# Patient Record
Sex: Female | Born: 1950 | Race: Black or African American | Hispanic: No | Smoking: Never smoker
Health system: Southern US, Community
[De-identification: ages and names within clinical notes are randomized; demographics above are authoritative.]

## PROBLEM LIST (undated history)

## (undated) DIAGNOSIS — I82409 Acute embolism and thrombosis of unspecified deep veins of unspecified lower extremity: Secondary | ICD-10-CM

## (undated) DIAGNOSIS — I10 Essential (primary) hypertension: Secondary | ICD-10-CM

## (undated) HISTORY — PX: KNEE SURGERY: SHX244

## (undated) HISTORY — PX: JOINT REPLACEMENT: SHX530

## (undated) HISTORY — DX: Acute embolism and thrombosis of unspecified deep veins of unspecified lower extremity: I82.409

## (undated) HISTORY — PX: ABDOMINAL HYSTERECTOMY: SHX81

## (undated) HISTORY — DX: Essential (primary) hypertension: I10

## (undated) HISTORY — PX: KNEE ARTHROSCOPY: SUR90

---

## 2001-04-11 ENCOUNTER — Emergency Department (HOSPITAL_COMMUNITY): Admission: EM | Admit: 2001-04-11 | Discharge: 2001-04-11 | Payer: Self-pay | Admitting: Emergency Medicine

## 2001-04-11 ENCOUNTER — Encounter: Payer: Self-pay | Admitting: Emergency Medicine

## 2001-11-09 ENCOUNTER — Emergency Department (HOSPITAL_COMMUNITY): Admission: EM | Admit: 2001-11-09 | Discharge: 2001-11-09 | Payer: Self-pay | Admitting: Emergency Medicine

## 2001-11-09 ENCOUNTER — Encounter: Payer: Self-pay | Admitting: Emergency Medicine

## 2004-05-09 ENCOUNTER — Ambulatory Visit: Payer: Self-pay | Admitting: *Deleted

## 2004-05-10 ENCOUNTER — Ambulatory Visit: Payer: Self-pay | Admitting: *Deleted

## 2004-05-10 ENCOUNTER — Ambulatory Visit (HOSPITAL_COMMUNITY): Admission: RE | Admit: 2004-05-10 | Discharge: 2004-05-10 | Payer: Self-pay | Admitting: *Deleted

## 2004-05-24 ENCOUNTER — Ambulatory Visit (HOSPITAL_COMMUNITY): Admission: RE | Admit: 2004-05-24 | Discharge: 2004-05-24 | Payer: Self-pay | Admitting: *Deleted

## 2004-05-24 ENCOUNTER — Ambulatory Visit: Payer: Self-pay | Admitting: Cardiology

## 2004-06-02 ENCOUNTER — Ambulatory Visit: Payer: Self-pay | Admitting: *Deleted

## 2005-03-16 ENCOUNTER — Encounter (HOSPITAL_COMMUNITY): Admission: RE | Admit: 2005-03-16 | Discharge: 2005-04-07 | Payer: Self-pay | Admitting: Neurosurgery

## 2005-03-17 ENCOUNTER — Ambulatory Visit (HOSPITAL_COMMUNITY): Admission: RE | Admit: 2005-03-17 | Discharge: 2005-03-17 | Payer: Self-pay | Admitting: Family Medicine

## 2005-04-07 ENCOUNTER — Ambulatory Visit (HOSPITAL_COMMUNITY): Admission: RE | Admit: 2005-04-07 | Discharge: 2005-04-07 | Payer: Self-pay | Admitting: Internal Medicine

## 2005-04-07 ENCOUNTER — Ambulatory Visit: Payer: Self-pay | Admitting: Internal Medicine

## 2008-06-12 ENCOUNTER — Ambulatory Visit: Payer: Self-pay | Admitting: Cardiology

## 2008-06-17 ENCOUNTER — Ambulatory Visit: Payer: Self-pay | Admitting: Internal Medicine

## 2008-06-17 ENCOUNTER — Ambulatory Visit (HOSPITAL_COMMUNITY): Admission: RE | Admit: 2008-06-17 | Discharge: 2008-06-17 | Payer: Self-pay | Admitting: Cardiology

## 2008-06-17 ENCOUNTER — Encounter: Payer: Self-pay | Admitting: Cardiology

## 2008-10-15 ENCOUNTER — Telehealth: Payer: Self-pay | Admitting: Cardiology

## 2008-11-16 ENCOUNTER — Ambulatory Visit (HOSPITAL_COMMUNITY): Admission: RE | Admit: 2008-11-16 | Discharge: 2008-11-16 | Payer: Self-pay | Admitting: Family Medicine

## 2010-09-02 NOTE — Procedures (Signed)
NAME:  Meredith Warner, Meredith Warner NO.:  [id number]   MEDICAL RECORD NO.:  [id number]          PATIENT TYPE:  OUT   LOCATION:  RAD                           FACILITY:  [facility]   PHYSICIAN:  Vida Roller, M.D.   DATE OF BIRTH:  [birth year]   DATE OF PROCEDURE:  05/10/2004  DATE OF DISCHARGE:                                  ECHOCARDIOGRAM   PRIMARY CARE PHYSICIAN:  Patrica Duel, M.D.   TAPE NUMBER:  LV [id number].   TAPE COUNT:  [id number] through 7132.   This is a 60 year old woman with chest discomfort and a history of lower  extremity DVT.   The technical quality of the study is adequate.   M-MODE:  Aorta is 20 mm.  Left atrium 47 mm.  Septum is 11 mm.  Posterior  wall 11 mm.  Left jugular diastolic dimension is 40 mm.  Left ventricular  systolic dimension 26 mm.   2D AND DOPPLER IMAGING:  The left ventricle is normal size with normal  systolic function. Estimated ejection fraction is 55-60%. There are no  regional wall motion abnormalities. There is wall thickness which is top  limits of normal but no obvious left ventricular hypertrophy.   The right ventricle is top-normal size. There are some views, especially the  apical four-chamber view, which implies a slightly enlarged right ventricle;  however, the subcostal views do not verify this. There appears to be normal  right ventricular systolic function. The right ventricular systolic  pressure, as estimated by the tricuspid regurgitation jet velocity, is 20-25  mmHg, which is within normal limits.   The atria are both dilated, left greater than right.   The aortic valve is very mildly sclerotic with no evidence of stenosis or  regurgitation.   There is trivial mitral regurgitation with no mitral stenosis.   Tricuspid valve has mild regurgitation, which is in the central jet. There  is no evidence of annular dilatation.   Pulmonic valves is not well seen.   The ascending aorta is not well seen.   There is no  pericardial effusion.   The inferior vena cava appears to be normal size.      JH/MEDQ  D:  05/10/2004  T:  05/10/2004  Job:  [id number]   cc:   Patrica Duel, M.D.  [address], [address]  [place]  [place] [address]  Fax: [phone]

## 2010-09-02 NOTE — Op Note (Signed)
NAME:  [name], [name] NO.:  [id number]   MEDICAL RECORD NO.:  [id number]          PATIENT TYPE:  AMB   LOCATION:  DAY                           FACILITY:  [facility]   PHYSICIAN:  Lionel December, M.D.    DATE OF BIRTH:  06/23/1950   DATE OF PROCEDURE:  04/07/2005  DATE OF DISCHARGE:                                 OPERATIVE REPORT   PROCEDURE:  Colonoscopy.   INDICATIONS:  Meredith Warner is a 60 year old Afro-American female who is undergoing  screening colonoscopy. Family history is negative for colorectal carcinoma.  Procedure and risk were reviewed with the patient, and informed consent was  obtained.   MEDICINES FOR CONSCIOUS SEDATION:  Demerol 50 mg IV, Versed 8 mg IV.   FINDINGS:  Procedure performed in endoscopy suite. The patient's vital signs  and O2 saturation were monitored during the procedure and remained stable.  The patient was placed in left lateral position and rectal examination  performed. No abnormality noted on external or digital exam. Olympus  videoscope was placed in rectum and advanced under vision in sigmoid colon  where few tiny diverticula were noted. Preparation was felt to be excellent.  Scope was advanced into cecum which was identified by lipomatous ileocecal  valve and appendiceal orifice. Pictures taken for the record. As the scope  was withdrawn, colonic mucosa was carefully examined and was normal  throughout. Rectal mucosa similarly was normal. Scope was retroflexed to  examine anorectal junction, and small hemorrhoids were noted below the  dentate line. Endoscope was straightened and withdrawn. The patient  tolerated the procedure well.   FINAL DIAGNOSIS:  Few tiny diverticula at sigmoid colon and small external  hemorrhoids, otherwise normal colonoscopy.   RECOMMENDATIONS:  1.  She will resume her usual diet and medications.  2.  High-fiber diet.  3.  Yearly Hemoccults.  4.  She may consider next screening exam in 10 years from  now.      Lionel December, M.D.  Electronically Signed     NR/MEDQ  D:  04/07/2005  T:  04/08/2005  Job:  [id number]

## 2015-03-19 ENCOUNTER — Encounter (INDEPENDENT_AMBULATORY_CARE_PROVIDER_SITE_OTHER): Payer: Self-pay | Admitting: *Deleted

## 2017-11-21 DIAGNOSIS — Z6841 Body Mass Index (BMI) 40.0 and over, adult: Secondary | ICD-10-CM | POA: Diagnosis not present

## 2017-11-21 DIAGNOSIS — Z0000 Encounter for general adult medical examination without abnormal findings: Secondary | ICD-10-CM | POA: Diagnosis not present

## 2017-12-11 DIAGNOSIS — R928 Other abnormal and inconclusive findings on diagnostic imaging of breast: Secondary | ICD-10-CM | POA: Diagnosis not present

## 2017-12-11 DIAGNOSIS — Z1231 Encounter for screening mammogram for malignant neoplasm of breast: Secondary | ICD-10-CM | POA: Diagnosis not present

## 2017-12-24 DIAGNOSIS — E2839 Other primary ovarian failure: Secondary | ICD-10-CM | POA: Diagnosis not present

## 2017-12-24 DIAGNOSIS — M810 Age-related osteoporosis without current pathological fracture: Secondary | ICD-10-CM | POA: Diagnosis not present

## 2017-12-26 DIAGNOSIS — R590 Localized enlarged lymph nodes: Secondary | ICD-10-CM | POA: Diagnosis not present

## 2017-12-26 DIAGNOSIS — R928 Other abnormal and inconclusive findings on diagnostic imaging of breast: Secondary | ICD-10-CM | POA: Diagnosis not present

## 2018-02-18 DIAGNOSIS — E7849 Other hyperlipidemia: Secondary | ICD-10-CM | POA: Diagnosis not present

## 2018-02-18 DIAGNOSIS — Z6841 Body Mass Index (BMI) 40.0 and over, adult: Secondary | ICD-10-CM | POA: Diagnosis not present

## 2018-02-18 DIAGNOSIS — I10 Essential (primary) hypertension: Secondary | ICD-10-CM | POA: Diagnosis not present

## 2018-02-18 DIAGNOSIS — Z0000 Encounter for general adult medical examination without abnormal findings: Secondary | ICD-10-CM | POA: Diagnosis not present

## 2018-03-12 DIAGNOSIS — F4310 Post-traumatic stress disorder, unspecified: Secondary | ICD-10-CM | POA: Diagnosis not present

## 2018-03-12 DIAGNOSIS — F331 Major depressive disorder, recurrent, moderate: Secondary | ICD-10-CM | POA: Diagnosis not present

## 2018-05-21 DIAGNOSIS — Z6841 Body Mass Index (BMI) 40.0 and over, adult: Secondary | ICD-10-CM | POA: Diagnosis not present

## 2018-05-21 DIAGNOSIS — I10 Essential (primary) hypertension: Secondary | ICD-10-CM | POA: Diagnosis not present

## 2018-05-21 DIAGNOSIS — E7849 Other hyperlipidemia: Secondary | ICD-10-CM | POA: Diagnosis not present

## 2018-06-03 DIAGNOSIS — M79604 Pain in right leg: Secondary | ICD-10-CM | POA: Diagnosis not present

## 2018-06-03 DIAGNOSIS — M7989 Other specified soft tissue disorders: Secondary | ICD-10-CM | POA: Diagnosis not present

## 2018-06-03 DIAGNOSIS — M79661 Pain in right lower leg: Secondary | ICD-10-CM | POA: Diagnosis not present

## 2018-08-20 DIAGNOSIS — M25561 Pain in right knee: Secondary | ICD-10-CM | POA: Diagnosis not present

## 2018-08-20 DIAGNOSIS — Z6841 Body Mass Index (BMI) 40.0 and over, adult: Secondary | ICD-10-CM | POA: Diagnosis not present

## 2018-08-26 DIAGNOSIS — M25561 Pain in right knee: Secondary | ICD-10-CM | POA: Diagnosis not present

## 2018-08-26 DIAGNOSIS — M25461 Effusion, right knee: Secondary | ICD-10-CM | POA: Diagnosis not present

## 2018-09-03 DIAGNOSIS — M25561 Pain in right knee: Secondary | ICD-10-CM | POA: Diagnosis not present

## 2018-09-03 DIAGNOSIS — Z6841 Body Mass Index (BMI) 40.0 and over, adult: Secondary | ICD-10-CM | POA: Diagnosis not present

## 2018-09-03 DIAGNOSIS — I10 Essential (primary) hypertension: Secondary | ICD-10-CM | POA: Diagnosis not present

## 2018-12-05 DIAGNOSIS — Z6841 Body Mass Index (BMI) 40.0 and over, adult: Secondary | ICD-10-CM | POA: Diagnosis not present

## 2018-12-05 DIAGNOSIS — M1710 Unilateral primary osteoarthritis, unspecified knee: Secondary | ICD-10-CM | POA: Diagnosis not present

## 2018-12-05 DIAGNOSIS — M25561 Pain in right knee: Secondary | ICD-10-CM | POA: Diagnosis not present

## 2018-12-05 DIAGNOSIS — R7303 Prediabetes: Secondary | ICD-10-CM | POA: Diagnosis not present

## 2018-12-05 DIAGNOSIS — I10 Essential (primary) hypertension: Secondary | ICD-10-CM | POA: Diagnosis not present

## 2019-01-04 DIAGNOSIS — Z20828 Contact with and (suspected) exposure to other viral communicable diseases: Secondary | ICD-10-CM | POA: Diagnosis not present

## 2019-02-06 DIAGNOSIS — M25561 Pain in right knee: Secondary | ICD-10-CM | POA: Diagnosis not present

## 2019-02-06 DIAGNOSIS — I10 Essential (primary) hypertension: Secondary | ICD-10-CM | POA: Diagnosis not present

## 2019-02-06 DIAGNOSIS — M1710 Unilateral primary osteoarthritis, unspecified knee: Secondary | ICD-10-CM | POA: Diagnosis not present

## 2019-02-06 DIAGNOSIS — Z6841 Body Mass Index (BMI) 40.0 and over, adult: Secondary | ICD-10-CM | POA: Diagnosis not present

## 2019-02-06 DIAGNOSIS — N3090 Cystitis, unspecified without hematuria: Secondary | ICD-10-CM | POA: Diagnosis not present

## 2019-02-06 DIAGNOSIS — H10213 Acute toxic conjunctivitis, bilateral: Secondary | ICD-10-CM | POA: Diagnosis not present

## 2019-02-06 DIAGNOSIS — M65311 Trigger thumb, right thumb: Secondary | ICD-10-CM | POA: Diagnosis not present

## 2019-05-24 ENCOUNTER — Ambulatory Visit: Payer: Self-pay | Attending: Internal Medicine

## 2019-05-24 DIAGNOSIS — Z23 Encounter for immunization: Secondary | ICD-10-CM | POA: Insufficient documentation

## 2019-05-24 NOTE — Progress Notes (Signed)
   Covid-19 Vaccination Clinic  Name:  Meredith Warner    MRN: [id number] DOB: 07-22-1950  05/24/2019  [name] was observed post Covid-19 immunization for 15 minutes without incidence. She was provided with Vaccine Information Sheet and instruction to access the V-Safe system.   [name] was instructed to call 911 with any severe reactions post vaccine: Marland Kitchen Difficulty breathing  . Swelling of your face and throat  . A fast heartbeat  . A bad rash all over your body  . Dizziness and weakness    Immunizations Administered    Name Date Dose VIS Date Route   Moderna COVID-19 Vaccine 05/24/2019  1:11 PM 0.5 mL 03/18/2019 Intramuscular   Manufacturer: Moderna   Lot: [id number]   NDC: 58527-782-42

## 2019-06-18 DIAGNOSIS — I10 Essential (primary) hypertension: Secondary | ICD-10-CM | POA: Diagnosis not present

## 2019-06-18 DIAGNOSIS — N3090 Cystitis, unspecified without hematuria: Secondary | ICD-10-CM | POA: Diagnosis not present

## 2019-06-18 DIAGNOSIS — Z0000 Encounter for general adult medical examination without abnormal findings: Secondary | ICD-10-CM | POA: Diagnosis not present

## 2019-06-18 DIAGNOSIS — Z6841 Body Mass Index (BMI) 40.0 and over, adult: Secondary | ICD-10-CM | POA: Diagnosis not present

## 2019-06-18 DIAGNOSIS — M170 Bilateral primary osteoarthritis of knee: Secondary | ICD-10-CM | POA: Diagnosis not present

## 2019-06-18 DIAGNOSIS — Z131 Encounter for screening for diabetes mellitus: Secondary | ICD-10-CM | POA: Diagnosis not present

## 2019-06-24 ENCOUNTER — Ambulatory Visit: Payer: Self-pay | Attending: Internal Medicine

## 2019-06-24 DIAGNOSIS — Z23 Encounter for immunization: Secondary | ICD-10-CM | POA: Insufficient documentation

## 2019-06-24 NOTE — Progress Notes (Signed)
   Covid-19 Vaccination Clinic  Name:  Meredith Warner    MRN: [id number] DOB: 10/29/1950  06/24/2019  Meredith Warner was observed post Covid-19 immunization for 15 minutes without incident. She was provided with Vaccine Information Sheet and instruction to access the V-Safe system.   Meredith Warner was instructed to call 911 with any severe reactions post vaccine: Marland Kitchen Difficulty breathing  . Swelling of face and throat  . A fast heartbeat  . A bad rash all over body  . Dizziness and weakness   Immunizations Administered    Name Date Dose VIS Date Route   Moderna COVID-19 Vaccine 06/24/2019 12:59 PM 0.5 mL 03/18/2019 Intramuscular   Manufacturer: Moderna   Lot: [id number]   NDC: 25672-091-98

## 2019-08-27 DIAGNOSIS — Z0000 Encounter for general adult medical examination without abnormal findings: Secondary | ICD-10-CM | POA: Diagnosis not present

## 2019-08-27 DIAGNOSIS — E559 Vitamin D deficiency, unspecified: Secondary | ICD-10-CM | POA: Diagnosis not present

## 2019-08-27 DIAGNOSIS — I10 Essential (primary) hypertension: Secondary | ICD-10-CM | POA: Diagnosis not present

## 2019-08-27 DIAGNOSIS — R5383 Other fatigue: Secondary | ICD-10-CM | POA: Diagnosis not present

## 2019-08-27 DIAGNOSIS — M170 Bilateral primary osteoarthritis of knee: Secondary | ICD-10-CM | POA: Diagnosis not present

## 2019-08-27 DIAGNOSIS — Z6841 Body Mass Index (BMI) 40.0 and over, adult: Secondary | ICD-10-CM | POA: Diagnosis not present

## 2019-11-06 DIAGNOSIS — Z6841 Body Mass Index (BMI) 40.0 and over, adult: Secondary | ICD-10-CM | POA: Diagnosis not present

## 2019-11-06 DIAGNOSIS — M25511 Pain in right shoulder: Secondary | ICD-10-CM | POA: Diagnosis not present

## 2019-11-06 DIAGNOSIS — L570 Actinic keratosis: Secondary | ICD-10-CM | POA: Diagnosis not present

## 2019-11-06 DIAGNOSIS — Z1231 Encounter for screening mammogram for malignant neoplasm of breast: Secondary | ICD-10-CM | POA: Diagnosis not present

## 2019-11-06 DIAGNOSIS — T169XXA Foreign body in ear, unspecified ear, initial encounter: Secondary | ICD-10-CM | POA: Diagnosis not present

## 2019-11-27 DIAGNOSIS — E7849 Other hyperlipidemia: Secondary | ICD-10-CM | POA: Diagnosis not present

## 2019-11-27 DIAGNOSIS — I10 Essential (primary) hypertension: Secondary | ICD-10-CM | POA: Diagnosis not present

## 2019-11-27 DIAGNOSIS — Z6841 Body Mass Index (BMI) 40.0 and over, adult: Secondary | ICD-10-CM | POA: Diagnosis not present

## 2019-11-27 DIAGNOSIS — M25511 Pain in right shoulder: Secondary | ICD-10-CM | POA: Diagnosis not present

## 2019-11-27 DIAGNOSIS — M170 Bilateral primary osteoarthritis of knee: Secondary | ICD-10-CM | POA: Diagnosis not present

## 2019-12-24 ENCOUNTER — Telehealth: Payer: Self-pay | Admitting: Orthopedic Surgery

## 2019-12-24 NOTE — Telephone Encounter (Signed)
Patient called to inquire about scheduling appointment with our office, preferably with Dr Romeo Apple, for a shoulder problem; states has been treating with primary care in [place]. York Spaniel will ask her doctor if he can send a referral; voiced understanding of related notes/imaging needed for referral appointment.

## 2020-01-14 ENCOUNTER — Ambulatory Visit: Payer: Medicare HMO

## 2020-01-14 ENCOUNTER — Encounter: Payer: Self-pay | Admitting: Orthopedic Surgery

## 2020-01-14 ENCOUNTER — Ambulatory Visit: Payer: Medicare HMO | Admitting: Orthopedic Surgery

## 2020-01-14 ENCOUNTER — Other Ambulatory Visit: Payer: Self-pay

## 2020-01-14 VITALS — BP 116/67 | HR 72 | Ht 68.0 in | Wt 334.0 lb

## 2020-01-14 DIAGNOSIS — M19011 Primary osteoarthritis, right shoulder: Secondary | ICD-10-CM

## 2020-01-14 DIAGNOSIS — Z6841 Body Mass Index (BMI) 40.0 and over, adult: Secondary | ICD-10-CM | POA: Diagnosis not present

## 2020-01-14 DIAGNOSIS — G8929 Other chronic pain: Secondary | ICD-10-CM

## 2020-01-14 DIAGNOSIS — M7501 Adhesive capsulitis of right shoulder: Secondary | ICD-10-CM | POA: Diagnosis not present

## 2020-01-14 DIAGNOSIS — M25511 Pain in right shoulder: Secondary | ICD-10-CM | POA: Diagnosis not present

## 2020-01-14 NOTE — Patient Instructions (Signed)
Continue naproxen  Start physical therapy  Follow-up 2 months  You have received an injection of steroids into the joint. 15% of patients will have increased pain within the 24 hours postinjection.   This is transient and will go away.   We recommend that you use ice packs on the injection site for 20 minutes every 2 hours and extra strength Tylenol 2 tablets every 8 as needed until the pain resolves.  If you continue to have pain after taking the Tylenol and using the ice please call the office for further instructions.

## 2020-01-14 NOTE — Progress Notes (Addendum)
Chief Complaint  Patient presents with  . Shoulder Pain    right since March    Right shoulder pain for close to a month.  Patient was in a recliner pulled on the recliner the liver could not lift the recliner she felt pain in her right shoulder about a week after that.  There was some concern that she had Covid shoulder from the vaccine however patient says she had no pain prior to this had no functional loss prior to this  So it looks like the pain and dysfunction started 1 week after pulling on the lever of the chair  Review of systems right ear pain right shoulder pain right knee pain although other systems were negative  Past Medical History:  Diagnosis Date  . Deep vein thrombosis [facility])     Past Surgical History:  Procedure Laterality Date  . ABDOMINAL HYSTERECTOMY    . 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 KNEE ARTHROSCOPY Right    right knee arthroscopy   . KNEE SURGERY      Social History   Tobacco Use  . Smoking status: Never Smoker  . Smokeless tobacco: Never Used  Substance Use Topics  . Alcohol use: Not on file  . Drug use: Not on file    BP 116/67   Pulse 72   Ht 5\' 8"  (1.727 m)   Wt (!) 334 lb (151.5 kg)   BMI 50.78 kg/m   She is awake alert and oriented x3 mood and affect anxious  She has significant obesity  The patient meets the AMA guidelines for Morbid (severe) obesity with a BMI > 40.0 and I have recommended weight loss.  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  Humeral head looks really flattened on  the lateral view.  Impression glenohumeral arthritis  Note from Dr. indicate patient's obesity and chronic medical problems which contribute to the present illness in terms of making surgery more difficult call here.  Continue naproxen Start physical therapy Subacromial injection Follow-up in 2 months   Procedure note the subacromial injection shoulder RIGHT    Verbal consent was obtained to inject the  RIGHT   Shoulder  Timeout was completed to confirm the injection site is a subacromial space of the  RIGHT  shoulder   Medication used Depo-Medrol 40 mg and lidocaine 1% 3 cc  Anesthesia was provided by ethyl chloride  The injection was performed in the RIGHT  posterior subacromial space. After pinning the skin with alcohol and anesthetized the skin with ethyl chloride the subacromial space was injected using a 20-gauge needle. There were no complications  Sterile dressing was applied.

## 2020-01-20 ENCOUNTER — Other Ambulatory Visit: Payer: Self-pay

## 2020-01-20 ENCOUNTER — Ambulatory Visit (HOSPITAL_COMMUNITY): Payer: Medicare HMO | Attending: Orthopedic Surgery

## 2020-01-20 DIAGNOSIS — M25611 Stiffness of right shoulder, not elsewhere classified: Secondary | ICD-10-CM | POA: Diagnosis not present

## 2020-01-20 DIAGNOSIS — G8929 Other chronic pain: Secondary | ICD-10-CM

## 2020-01-20 DIAGNOSIS — M25511 Pain in right shoulder: Secondary | ICD-10-CM | POA: Diagnosis not present

## 2020-01-20 DIAGNOSIS — R29898 Other symptoms and signs involving the musculoskeletal system: Secondary | ICD-10-CM | POA: Diagnosis not present

## 2020-01-20 NOTE — Patient Instructions (Addendum)
Complete 10 reps at least once a day, hold each for 5 secs. Use a towel to help the arm glide on table. Only go as far as you feel the stretch.  1) SHOULDER: Flexion On Table   Place hands on towel placed on table, elbows straight. Lean forward with you upper body, pushing towel away from body.    2) Abduction (Passive)   With arm out to side, resting on towel placed on table with palm DOWN, keeping trunk away from table, lean to the side while pushing towel away from body. Is helpful to use a chair without arms to be able to lean to the side.    Copyright  VHI. All rights reserved.     3) Internal Rotation (Assistive)   Seated with elbow bent at right angle and held against side, slide arm on table surface in an inward arc keeping elbow anchored in place.  Activity: Use this motion to brush crumbs off the table.  Copyright  VHI. All rights reserved.

## 2020-01-20 NOTE — Therapy (Addendum)
[facility] [address] [place], [place], [address] Phone: [phone]   Fax:  [phone]  Occupational Therapy Evaluation  Patient Details  Name: Meredith Warner MRN: [id number] Date of Birth: October 03, 1950 Referring Provider (OT): Fuller Canada, MD   Encounter Date: 01/20/2020   OT End of Session - 01/20/20 1529    Visit Number 1    Number of Visits 12    Date for OT Re-Evaluation 03/02/20    Authorization Type [employer], $40 copay, no authorized visits    Progress Note Due on Visit 10    OT Start Time 1432    OT Stop Time 1516    OT Time Calculation (min) 44 min    Activity Tolerance Patient limited by pain    Behavior During Therapy [facility] for tasks assessed/performed           Past Medical History:  Diagnosis Date  . Deep vein thrombosis [facility])     Past Surgical History:  Procedure Laterality Date  . ABDOMINAL HYSTERECTOMY    . 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 KNEE ARTHROSCOPY Right    right knee arthroscopy   . KNEE SURGERY      There were no vitals filed for this visit.   Subjective Assessment - 01/20/20 1438    Subjective  S: I like very detailed instructions because my kids like a full report    Pertinent History Patient is a 69 y/o female s/p adhesive capsulitis and OA, who was referred by Dr. Romeo Apple for OT evaluation and treatment. Patient reports an onset of pain since March 2021 when she was attempting to recline her chair by pulling the lever for the foot rests, and felt immediate pain. Patient reported that she received a steroid injection at most recent visit, indicating that it made things worse initially in the first 24 hours and then the next day she was pain free, however reports that the days following post-injection, her pain has returned and progressively gotten worse.    Special Tests Complete next session    Patient Stated Goals Increase ROM to at least a 90% functional level      Currently in Pain? Yes    Pain Score 1    1 at rest, reports a 4 when actively moving   Pain Location Shoulder    Pain Orientation Right    Pain Descriptors / Indicators Aching;Dull    Pain Type Chronic pain    Pain Radiating Towards If putting pressure on elbow, pain radiates to her right ear    Pain Onset More than a month ago   Since march 2021   Pain Frequency Intermittent    Aggravating Factors  Pressure on elbow, back being pressed against cushion chair    Pain Relieving Factors [employer] topical cream (menthol), ice    Effect of Pain on Daily Activities Mod affect    Multiple Pain Sites No             [facility] OT Assessment - 01/20/20 1446      Assessment   Medical Diagnosis Right adhesive capsulitis/OA    Referring Provider (OT) Fuller Canada, MD    Onset Date/Surgical Date --   March 2021   Hand Dominance Right    Next MD Visit 03/17/20    Prior Therapy None reported      Precautions   Precautions None      Balance Screen   Has  the patient fallen in the past 6 months No      Prior Function   Level of Independence Independent    Vocation Part time employment    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, Animatorcomputer work    Leisure Read      ADL   ADL comments Patient has challenges with being able to make her bed, with showering and washing her back and bottom, carrying items that are more than 5 pounds, getting into her SUV where she has to pull herself onto the step, donning/doffing bra      ROM / Strength   AROM / PROM / Strength AROM;PROM;Strength      Palpation   Palpation comment Mod fascial restrictions palpated on the trapezius and scapularis regions      AROM   Overall AROM Comments Assessed seated, IR/er adducted    AROM Assessment Site Shoulder    Right/Left Shoulder Right    Right Shoulder Flexion 75 Degrees    Right Shoulder ABduction 83 Degrees    Right Shoulder Internal Rotation 90 Degrees    Right Shoulder External Rotation  25 Degrees      PROM   Overall PROM Comments Assessed supine, IR/er adducted    PROM Assessment Site Shoulder    Right/Left Shoulder Right    Right Shoulder Flexion 80 Degrees    Right Shoulder ABduction 75 Degrees    Right Shoulder Internal Rotation 90 Degrees    Right Shoulder External Rotation 40 Degrees      Strength   Overall Strength Unable to assess;Due to pain    Overall Strength Comments Not able to assess, however with clinical judgement    Strength Assessment Site Shoulder    Right/Left Shoulder Right    Right Shoulder Flexion 3-/5    Right Shoulder ABduction 3-/5    Right Shoulder Internal Rotation 3/5    Right Shoulder External Rotation 3-/5                           OT Education - 01/20/20 1532    Education Details Table slides, what frozen shoulder is    Person(s) Educated Patient    Methods Explanation;Handout;Demonstration;Verbal cues    Comprehension Verbalized understanding;Returned demonstration;Verbal cues required            OT Short Term Goals - 01/20/20 1547      OT SHORT TERM GOAL #1   Title Pt will be provided with and educated on HEP to improve mobility of RUE required for use during daily tasks.    Time 3    Period Weeks    Status New    Target Date 02/10/20      OT SHORT TERM GOAL #2   Title Pt will report a decrease in pain in RUE to 3/10 or less while completing daily self-care tasks such as making her bed.    Time 3    Period Weeks    Status New      OT SHORT TERM GOAL #3   Title Pt will increase RUE strength to 3+/5 or greater to improve ability to use LUE as dominant while carrying items 5 pounds or lighter.    Time 3    Period Weeks    Status New      OT SHORT TERM GOAL #4   Title Patient will increase P/ROM to [facility] to increase her mobility for functional reaching tasks at shoulder height.  Time 3    Period Weeks    Status New             OT Long Term Goals - 01/20/20 1557      OT LONG TERM GOAL #1    Title Patient will report using her RUE as dominant, in 90% or more of her daily functional activities.    Time 6    Period Weeks    Status New    Target Date 03/02/20      OT LONG TERM GOAL #2   Title Patient will decrease fascial restrictions to min amount, in order to increase her mobility in a pain free zone.    Time 6    Period Weeks    Status New      OT LONG TERM GOAL #3   Title Patient will improve her A/ROM to as WNL as possible to promote her independence with completing bathing and dressing tasks.    Time 6    Period Weeks    Status New      OT LONG TERM GOAL #4   Title Patient will increase her strength to at least 4/5 to allow her to use her RUE as dominant with getting into her SUV    Time 6    Period Weeks    Status New                 Plan - 01/20/20 1541    Clinical Impression Statement A: Patient is a 69 y/o female s/p adhesive capsulitis/OA, resulting in increased pain and fasical restrictions, decreased P/ROM, A/ROM and strength, which affects her ability to participate in basic selfcare tasks suchs as bathing and dressing, efficiently and pain free.    OT Occupational Profile and History Problem Focused Assessment - Including review of records relating to presenting problem    Occupational performance deficits (Please refer to evaluation for details): ADL's;IADL's;Rest and Sleep;Work;Leisure;Social Participation    Body Structure / Function / Physical Skills ADL;Endurance;Muscle spasms;UE functional use;Fascial restriction;Body mechanics;Flexibility;Pain;ROM;Strength    Rehab Potential Fair    Clinical Decision Making Several treatment options, min-mod task modification necessary    Comorbidities Affecting Occupational Performance: May have comorbidities impacting occupational performance    Modification or Assistance to Complete Evaluation  Min-Moderate modification of tasks or assist with assess necessary to complete eval    OT Frequency 2x / week     OT Duration 6 weeks    OT Treatment/Interventions Self-care/ADL training;Ultrasound;Patient/family education;Passive range of motion;Cryotherapy;Electrical Stimulation;Moist Heat;Therapeutic exercise;Manual Therapy;Therapeutic activities;Traction    Plan P: Patient would benefit from skilled OT intervention to address increased pain, and deficits in P/ROM, A/ROM and strength. Treatment plan: manual techniques, passive stretches, AA/ROM, A/ROM, general scapular strengthening, modalites prn. Complete next session: FOTO    OT Home Exercise Plan Eval: table slides    Consulted and Agree with Plan of Care Patient           Patient will benefit from skilled therapeutic intervention in order to improve the following deficits and impairments:   Body Structure / Function / Physical Skills: ADL, Endurance, Muscle spasms, UE functional use, Fascial restriction, Body mechanics, Flexibility, Pain, ROM, Strength       Visit Diagnosis: Chronic right shoulder pain  Stiffness of right shoulder, not elsewhere classified  Other symptoms and signs involving the musculoskeletal system    Problem List There are no problems to display for this patient.    [address] [place], [place] Student [phone]   01/21/2020, 8:41  AM  [facility] [address] [place], [place], [address] Phone: [phone]   Fax:  [phone]  Name: Meredith Warner MRN: [id number] Date of Birth: Aug 26, 1950

## 2020-01-21 NOTE — Addendum Note (Signed)
Addended by: Limmie Patricia D on: 01/21/2020 08:45 AM   Modules accepted: Orders

## 2020-01-22 ENCOUNTER — Ambulatory Visit: Payer: Medicare HMO

## 2020-01-22 DIAGNOSIS — K006 Disturbances in tooth eruption: Secondary | ICD-10-CM | POA: Diagnosis not present

## 2020-01-28 ENCOUNTER — Encounter (HOSPITAL_COMMUNITY): Payer: Self-pay | Admitting: Specialist

## 2020-01-28 ENCOUNTER — Other Ambulatory Visit: Payer: Self-pay

## 2020-01-28 ENCOUNTER — Ambulatory Visit (HOSPITAL_COMMUNITY): Payer: Medicare HMO | Admitting: Specialist

## 2020-01-28 DIAGNOSIS — G8929 Other chronic pain: Secondary | ICD-10-CM | POA: Diagnosis not present

## 2020-01-28 DIAGNOSIS — R29898 Other symptoms and signs involving the musculoskeletal system: Secondary | ICD-10-CM | POA: Diagnosis not present

## 2020-01-28 DIAGNOSIS — M25611 Stiffness of right shoulder, not elsewhere classified: Secondary | ICD-10-CM

## 2020-01-28 DIAGNOSIS — M25511 Pain in right shoulder: Secondary | ICD-10-CM | POA: Diagnosis not present

## 2020-01-28 NOTE — Patient Instructions (Addendum)
Perform each exercise ____10-15____ reps. 2-3x days.  Complete lying down on your back.    1) Protraction   Start by holding a wand or cane at chest height.  Next, slowly push the wand outwards in front of your body so that your elbows become fully straightened. Then, return to the original position.     2) Shoulder FLEXION   In the standing position, hold a wand/cane with both arms, palms down on both sides. Raise up the wand/cane allowing your unaffected arm to perform most of the effort. Your affected arm should be partially relaxed.      I found a few options for seat cushions: [email or web address]://www.[email or web address]/?spLa=ZW5jcnlwdGVkUXVhbGlmaWVyPUExUEJGVDVINlJHSkExJmVuY3J5cHRlZElkPUEwNzg4NDQ3VDFXWDcxNEdUUk5HJmVuY3J5cHRlZEFkSWQ9QTAyMTE3ODAyUlZMNU9CSlA4UDdLJndpZGdldE5hbWU9c3BfZGV0YWlsMiZhY3Rpb249Y2xpY2tSZWRpcmVjdCZkb05vdExvZ0NsaWNrPXRydWU&th=1 here are the aides to get in and out of the car. [email or web address].[email or web address]/

## 2020-01-28 NOTE — Therapy (Signed)
[facility] [address] [place], [place], [address] Phone: [phone]   Fax:  [phone]  Occupational Therapy Treatment  Patient Details  Name: Meredith Warner MRN: [id number] Date of Birth: March 01, 1951 Referring Provider (OT): Fuller Canada, MD   Encounter Date: 01/28/2020   OT End of Session - 01/28/20 1640    Visit Number 2    Number of Visits 12    Date for OT Re-Evaluation 03/02/20    Progress Note Due on Visit 10    OT Start Time 1445    OT Stop Time 1520   patient arrived at 245 for 230 appt   OT Time Calculation (min) 35 min    Activity Tolerance Patient limited by pain;Patient tolerated treatment well    Behavior During Therapy [facility] for tasks assessed/performed           Past Medical History:  Diagnosis Date  . Deep vein thrombosis [facility])     Past Surgical History:  Procedure Laterality Date  . ABDOMINAL HYSTERECTOMY    . 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 KNEE ARTHROSCOPY Right    right knee arthroscopy   . KNEE SURGERY      There were no vitals filed for this visit.   Subjective Assessment - 01/28/20 1638    Subjective  S:  I've got to get my mobility back.    Special Tests FOTO 50%    Currently in Pain? Yes    Pain Score 3     Pain Location Shoulder    Pain Orientation Right    Pain Descriptors / Indicators Aching    Pain Type Acute pain    Pain Radiating Towards shoulder    Pain Onset More than a month ago    Pain Frequency Intermittent    Aggravating Factors  pressure    Pain Relieving Factors ice, topical medication    Effect of Pain on Daily Activities moderate    Multiple Pain Sites No              [facility] OT Assessment - 01/28/20 0001      Assessment   Medical Diagnosis Right adhesive capsulitis/OA    Referring Provider (OT) Fuller Canada, MD      Precautions   Precautions None      Balance Screen   Has the patient fallen in the past 6 months No       Observation/Other Assessments   Focus on Therapeutic Outcomes (FOTO)  50% limited                    OT Treatments/Exercises (OP) - 01/28/20 0001      Exercises   Exercises Shoulder      Shoulder Exercises: Supine   Protraction PROM;AAROM;5 reps    External Rotation PROM;AAROM;5 reps    Internal Rotation PROM;AAROM;5 reps    Flexion PROM;AAROM;5 reps    ABduction PROM;AROM;Limitations    ABduction Limitations unable to complete aa.rom with dowel.  therapist unweighted arm and had her flex elbow to 90 degrees and complete aarom small arc to 90 degrees abduction      Shoulder Exercises: Seated   Elevation AROM;10 reps    Extension AROM;10 reps    Row AROM;10 reps      Manual Therapy   Manual Therapy Myofascial release    Manual therapy comments manual therapy completed seperately from all other interventions this date of service  Myofascial Release myofascial release and manual stretching to right upper arm, scapular, and shoulder region to decrease pain and fascial restrictions and improve pain free moblity in her right shoulder region                  OT Education - 01/28/20 1640    Education Details aa/rom in supine flexion and protraction.  options for seat cushions and aides to get in and out of car.    Person(s) Educated Patient    Methods Explanation;Handout;Demonstration;Verbal cues    Comprehension Verbalized understanding;Returned demonstration;Verbal cues required            OT Short Term Goals - 01/28/20 1655      OT SHORT TERM GOAL #1   Title Pt will be provided with and educated on HEP to improve mobility of RUE required for use during daily tasks.    Time 3    Period Weeks    Status On-going    Target Date 02/10/20      OT SHORT TERM GOAL #2   Title Pt will report a decrease in pain in RUE to 3/10 or less while completing daily self-care tasks such as making her bed.    Time 3    Period Weeks    Status On-going      OT SHORT TERM  GOAL #3   Title Pt will increase RUE strength to 3+/5 or greater to improve ability to use LUE as dominant while carrying items 5 pounds or lighter.    Time 3    Period Weeks    Status On-going      OT SHORT TERM GOAL #4   Title Patient will increase P/ROM to [facility] to increase her mobility for functional reaching tasks at shoulder height.    Time 3    Period Weeks    Status On-going             OT Long Term Goals - 01/28/20 1655      OT LONG TERM GOAL #1   Title Patient will report using her RUE as dominant, in 90% or more of her daily functional activities.    Status On-going      OT LONG TERM GOAL #2   Title Patient will decrease fascial restrictions to min amount, in order to increase her mobility in a pain free zone.    Status On-going      OT LONG TERM GOAL #3   Title Patient will improve her A/ROM to as WNL as possible to promote her independence with completing bathing and dressing tasks.    Status On-going      OT LONG TERM GOAL #4   Title Patient will increase her strength to at least 4/5 to allow her to use her RUE as dominant with getting into her SUV    Status On-going                 Plan - 01/28/20 1648    Clinical Impression Statement A:  patient sensitive to manual therapy techniques therefore therapist used lighter touch technique.  patient motivated to complete exercises correctly and independently.  patient reports increased ease with dressing and less pain when her back leans up against a chair.    Body Structure / Function / Physical Skills ADL;Endurance;Muscle spasms;UE functional use;Fascial restriction;Body mechanics;Flexibility;Pain;ROM;Strength    Plan P:  continue to improve independence with aa/rom and improve available p/rom for increased ease with adls.    OT Home Exercise  Plan Eval: table slides, 10/13:  aa/rom in supine flexion and protraction and information on seat cushions and aides to get in and out of car.           Patient will  benefit from skilled therapeutic intervention in order to improve the following deficits and impairments:   Body Structure / Function / Physical Skills: ADL, Endurance, Muscle spasms, UE functional use, Fascial restriction, Body mechanics, Flexibility, Pain, ROM, Strength       Visit Diagnosis: Chronic right shoulder pain  Stiffness of right shoulder, not elsewhere classified  Other symptoms and signs involving the musculoskeletal system    Problem List There are no problems to display for this patient.   Shirlean Mylar, MHA, OTR/L [phone]  01/28/2020, 5:02 PM  [facility] [address] [place], [place], [address] Phone: [phone]   Fax:  [phone]  Name: Meredith Warner MRN: [id number] Date of Birth: [birth year]

## 2020-01-29 ENCOUNTER — Ambulatory Visit (HOSPITAL_COMMUNITY): Payer: Medicare HMO | Admitting: Specialist

## 2020-01-29 DIAGNOSIS — R29898 Other symptoms and signs involving the musculoskeletal system: Secondary | ICD-10-CM

## 2020-01-29 DIAGNOSIS — M25611 Stiffness of right shoulder, not elsewhere classified: Secondary | ICD-10-CM

## 2020-01-29 DIAGNOSIS — M25511 Pain in right shoulder: Secondary | ICD-10-CM

## 2020-01-29 DIAGNOSIS — G8929 Other chronic pain: Secondary | ICD-10-CM | POA: Diagnosis not present

## 2020-01-30 ENCOUNTER — Encounter (HOSPITAL_COMMUNITY): Payer: Self-pay | Admitting: Specialist

## 2020-01-30 NOTE — Therapy (Signed)
[facility] [address] [place], [place], [address] Phone: [phone]   Fax:  [phone]  Occupational Therapy Treatment  Patient Details  Name: Meredith Warner MRN: [id number] Date of Birth: 04/06/1951 Referring Provider (OT): Fuller Canada, MD   Encounter Date: 01/29/2020   OT End of Session - 01/30/20 0655    Visit Number 3    Number of Visits 12    Date for OT Re-Evaluation 03/02/20    Authorization Type [employer], $40 copay, no authorized visits    OT Start Time 1445   patient arrived at 1445 for 1430 appt   OT Stop Time 1515    OT Time Calculation (min) 30 min    Activity Tolerance Patient limited by pain;Patient tolerated treatment well    Behavior During Therapy WFL for tasks assessed/performed           Past Medical History:  Diagnosis Date   Deep vein thrombosis (HCC)     Past Surgical History:  Procedure Laterality Date   ABDOMINAL HYSTERECTOMY     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   Subjective Assessment - 01/30/20 0654    Subjective  S:  I am sore today, I tried to pick up a package from my porch.  Thank you for the recommendations on chair cushions and devices to get in and out of my car.              [facility] OT Assessment - 01/30/20 0001      Assessment   Medical Diagnosis Right adhesive capsulitis/OA    Referring Provider (OT) Fuller Canada, MD      Precautions   Precautions None                    OT Treatments/Exercises (OP) - 01/30/20 0001      ADLs   ADL Comments discussed possibiliites for entering and exiting lincoln navigator.  Patient would like to use a step stool to step up into car.  OT encouraged patient to problem solve through how to pick up stool once in car.  Therapist and patient went out to patients car in parking lot,  patient demonstrated how she gets in suv:  pulls up with right arm while stepping up with right foot.  OT recommended pulling up iwth left arm while stepping up with right, with possiblity of using assistive device at door level - patient not interested in attempting.  Discussed how to pick up a stool after getting in car.  discussed options of use of a reacher or even a strap.      Shoulder Exercises: Seated   Elevation AROM;10 reps    Extension AROM;10 reps    Row AROM;10 reps    Protraction AAROM;10 reps   closed chain against OT hand    Horizontal ABduction AROM;10 reps   min vg to decrease shoulder elevation   External Rotation AROM;10 reps    Internal Rotation AROM;10 reps    Flexion AAROM;10 reps   closed chain against OT hand    Abduction AAROM;10 reps   OT assist with unweighting      Shoulder Exercises: Therapy Ball   Flexion 10 reps   mod difficulty   ABduction 10 reps   min difficulty  Manual Therapy   Manual Therapy Myofascial release    Manual therapy comments manual therapy completed seperately from all other interventions this date of service  completed seated     Myofascial Release myofascial release and manual stretching to right upper arm, scapular, and shoulder region to decrease pain and fascial restrictions and improve pain free moblity in her right shoulder region                  OT Education - 01/30/20 0655    Education Details reviewed possiblilites for devices to aid getting in and out of the car.    Person(s) Educated Patient    Methods Explanation;Demonstration    Comprehension Verbalized understanding            OT Short Term Goals - 01/28/20 1655      OT SHORT TERM GOAL #1   Title Pt will be provided with and educated on HEP to improve mobility of RUE required for use during daily tasks.    Time 3    Period Weeks    Status On-going    Target Date 02/10/20      OT SHORT TERM GOAL #2   Title Pt will report a decrease in pain in RUE  to 3/10 or less while completing daily self-care tasks such as making her bed.    Time 3    Period Weeks    Status On-going      OT SHORT TERM GOAL #3   Title Pt will increase RUE strength to 3+/5 or greater to improve ability to use LUE as dominant while carrying items 5 pounds or lighter.    Time 3    Period Weeks    Status On-going      OT SHORT TERM GOAL #4   Title Patient will increase P/ROM to [facility] to increase her mobility for functional reaching tasks at shoulder height.    Time 3    Period Weeks    Status On-going             OT Long Term Goals - 01/28/20 1655      OT LONG TERM GOAL #1   Title Patient will report using her RUE as dominant, in 90% or more of her daily functional activities.    Status On-going      OT LONG TERM GOAL #2   Title Patient will decrease fascial restrictions to min amount, in order to increase her mobility in a pain free zone.    Status On-going      OT LONG TERM GOAL #3   Title Patient will improve her A/ROM to as WNL as possible to promote her independence with completing bathing and dressing tasks.    Status On-going      OT LONG TERM GOAL #4   Title Patient will increase her strength to at least 4/5 to allow her to use her RUE as dominant with getting into her SUV    Status On-going                 Plan - 01/30/20 0656    Clinical Impression Statement A: Completed manual therapy in seated vs supine this date.  Patient tolerated this transition well.  Discussed options for seat cushions and assistive devices to get in and out of the car.    Body Structure / Function / Physical Skills ADL;Endurance;Muscle spasms;UE functional use;Fascial restriction;Body mechanics;Flexibility;Pain;ROM;Strength    Plan P:  Continue to determine approaches to improve independence  and safety entering and exiting car.  Attempt scapular theraband, wall wash, proximal shoudler strengthening.           Patient will benefit from skilled therapeutic  intervention in order to improve the following deficits and impairments:   Body Structure / Function / Physical Skills: ADL, Endurance, Muscle spasms, UE functional use, Fascial restriction, Body mechanics, Flexibility, Pain, ROM, Strength       Visit Diagnosis: Chronic right shoulder pain  Stiffness of right shoulder, not elsewhere classified  Other symptoms and signs involving the musculoskeletal system    Problem List There are no problems to display for this patient.   Shirlean Mylar, MHA, OTR/L [phone]  01/30/2020, 7:14 AM  [facility] [address] [place], [place], [address] Phone: [phone]   Fax:  [phone]  Name: Meredith Warner MRN: [id number] Date of Birth: Sep 09, 1950

## 2020-02-03 ENCOUNTER — Other Ambulatory Visit: Payer: Self-pay

## 2020-02-03 ENCOUNTER — Encounter (HOSPITAL_COMMUNITY): Payer: Self-pay | Admitting: Occupational Therapy

## 2020-02-03 ENCOUNTER — Ambulatory Visit (HOSPITAL_COMMUNITY): Payer: Medicare HMO | Admitting: Occupational Therapy

## 2020-02-03 DIAGNOSIS — M25511 Pain in right shoulder: Secondary | ICD-10-CM

## 2020-02-03 DIAGNOSIS — M25611 Stiffness of right shoulder, not elsewhere classified: Secondary | ICD-10-CM

## 2020-02-03 DIAGNOSIS — G8929 Other chronic pain: Secondary | ICD-10-CM

## 2020-02-03 DIAGNOSIS — R29898 Other symptoms and signs involving the musculoskeletal system: Secondary | ICD-10-CM

## 2020-02-03 NOTE — Therapy (Signed)
[facility] [address] [place], [place], [address] Phone: [phone]   Fax:  [phone]  Occupational Therapy Treatment  Patient Details  Name: Meredith Warner MRN: [id number] Date of Birth: 02/20/1951 Referring Provider (OT): Fuller Canada, MD   Encounter Date: 02/03/2020   OT End of Session - 02/03/20 1522    Visit Number 4    Number of Visits 12    Date for OT Re-Evaluation 03/02/20    Authorization Type [employer], $40 copay, no authorized visits    OT Start Time 1436    OT Stop Time 1515    OT Time Calculation (min) 39 min    Activity Tolerance Patient limited by pain;Patient tolerated treatment well    Behavior During Therapy [facility] for tasks assessed/performed           Past Medical History:  Diagnosis Date  . Deep vein thrombosis [facility])     Past Surgical History:  Procedure Laterality Date  . ABDOMINAL HYSTERECTOMY    . 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 KNEE ARTHROSCOPY Right    right knee arthroscopy   . KNEE SURGERY      There were no vitals filed for this visit.   Subjective Assessment - 02/03/20 1436    Subjective  S: I had a rough weekend.    Currently in Pain? Yes    Pain Score 4     Pain Location Shoulder    Pain Orientation Right    Pain Descriptors / Indicators Aching;Sore    Pain Type Acute pain    Pain Radiating Towards shoulder blade area    Pain Onset More than a month ago    Pain Frequency Intermittent    Aggravating Factors  unsure    Pain Relieving Factors topical medication, medication, heat    Effect of Pain on Daily Activities mod effect on ADLs    Multiple Pain Sites No              [facility] OT Assessment - 02/03/20 1435      Assessment   Medical Diagnosis Right adhesive capsulitis/OA      Precautions   Precautions None                    OT Treatments/Exercises (OP) - 02/03/20 1440      Exercises   Exercises Shoulder      Shoulder  Exercises: Seated   Protraction AAROM;10 reps    External Rotation AROM;10 reps    Internal Rotation AROM;10 reps    Flexion AAROM;10 reps    Abduction AAROM;10 reps      Shoulder Exercises: Standing   Extension AROM;10 reps    Row AROM;10 reps    Shoulder Elevation AROM;10 reps    Other Standing Exercises scapular depression, A/ROM, 10X      Shoulder Exercises: ROM/Strengthening   Wall Wash 1'      Manual Therapy   Manual Therapy Myofascial release    Manual therapy comments manual therapy completed seperately from all other interventions this date of service  completed seated     Myofascial Release myofascial release and manual stretching to right upper arm, scapular, and shoulder region to decrease pain and fascial restrictions and improve pain free moblity in her right shoulder region                    OT Short Term Goals - 01/28/20 1655  OT SHORT TERM GOAL #1   Title Pt will be provided with and educated on HEP to improve mobility of RUE required for use during daily tasks.    Time 3    Period Weeks    Status On-going    Target Date 02/10/20      OT SHORT TERM GOAL #2   Title Pt will report a decrease in pain in RUE to 3/10 or less while completing daily self-care tasks such as making her bed.    Time 3    Period Weeks    Status On-going      OT SHORT TERM GOAL #3   Title Pt will increase RUE strength to 3+/5 or greater to improve ability to use LUE as dominant while carrying items 5 pounds or lighter.    Time 3    Period Weeks    Status On-going      OT SHORT TERM GOAL #4   Title Patient will increase P/ROM to [facility] to increase her mobility for functional reaching tasks at shoulder height.    Time 3    Period Weeks    Status On-going             OT Long Term Goals - 01/28/20 1655      OT LONG TERM GOAL #1   Title Patient will report using her RUE as dominant, in 90% or more of her daily functional activities.    Status On-going      OT  LONG TERM GOAL #2   Title Patient will decrease fascial restrictions to min amount, in order to increase her mobility in a pain free zone.    Status On-going      OT LONG TERM GOAL #3   Title Patient will improve her A/ROM to as WNL as possible to promote her independence with completing bathing and dressing tasks.    Status On-going      OT LONG TERM GOAL #4   Title Patient will increase her strength to at least 4/5 to allow her to use her RUE as dominant with getting into her SUV    Status On-going                 Plan - 02/03/20 1523    Clinical Impression Statement A: Pt reporting more pain going down shoulder blade today. Continued manual therapy in sitting versus supine, then transitioned directly to AA/ROM. Pt with consistent popping and creptius at approximately 90 degrees, pt wincing and reporting pain with popping. Educated on stretching up to that point and stopping, not pushing past the popping due to increased pain. Attempted wall wash, limited in ROM achieved due to painful crepitus. Verbal cuing for form and technique.    Body Structure / Function / Physical Skills ADL;Endurance;Muscle spasms;UE functional use;Fascial restriction;Body mechanics;Flexibility;Pain;ROM;Strength    Plan P: Continue with manual techniques, attempt wall wash and pulleys    OT Home Exercise Plan Eval: table slides, 10/13:  aa/rom in supine flexion and protraction and information on seat cushions and aides to get in and out of car.    Consulted and Agree with Plan of Care Patient           Patient will benefit from skilled therapeutic intervention in order to improve the following deficits and impairments:   Body Structure / Function / Physical Skills: ADL, Endurance, Muscle spasms, UE functional use, Fascial restriction, Body mechanics, Flexibility, Pain, ROM, Strength       Visit Diagnosis: Chronic  right shoulder pain  Stiffness of right shoulder, not elsewhere classified  Other  symptoms and signs involving the musculoskeletal system    Problem List There are no problems to display for this patient.  Ezra Sites, OTR/L  [phone] 02/03/2020, 3:26 PM  [facility] [address] [place], [place], [address] Phone: [phone]   Fax:  [phone]  Name: Meredith Warner MRN: [id number] Date of Birth: 1950-05-19

## 2020-02-05 ENCOUNTER — Ambulatory Visit (HOSPITAL_COMMUNITY): Payer: Medicare HMO | Admitting: Occupational Therapy

## 2020-02-05 ENCOUNTER — Encounter (HOSPITAL_COMMUNITY): Payer: Self-pay | Admitting: Occupational Therapy

## 2020-02-05 ENCOUNTER — Other Ambulatory Visit: Payer: Self-pay

## 2020-02-05 DIAGNOSIS — R29898 Other symptoms and signs involving the musculoskeletal system: Secondary | ICD-10-CM | POA: Diagnosis not present

## 2020-02-05 DIAGNOSIS — M25611 Stiffness of right shoulder, not elsewhere classified: Secondary | ICD-10-CM

## 2020-02-05 DIAGNOSIS — M25511 Pain in right shoulder: Secondary | ICD-10-CM | POA: Diagnosis not present

## 2020-02-05 DIAGNOSIS — G8929 Other chronic pain: Secondary | ICD-10-CM | POA: Diagnosis not present

## 2020-02-05 NOTE — Therapy (Signed)
[place], [place], [address] Phone: [phone]   Fax:  [phone]  Occupational Therapy Treatment  Patient Details  Name: Meredith Warner MRN: [id number] Date of Birth: 03/25/1951 Referring Provider (OT): Arther Abbott, MD   Encounter Date: 02/05/2020   OT End of Session - 02/05/20 1514    Visit Number 5    Number of Visits 12    Date for OT Re-Evaluation 03/02/20    Authorization Type [employer], $40 copay, no authorized visits    Progress Note Due on Visit 10    OT Start Time 2122   pt arrived late   OT Stop Time 4825    OT Time Calculation (min) 35 min    Activity Tolerance Patient limited by pain;Patient tolerated treatment well    Behavior During Therapy [facility] for tasks assessed/performed           Past Medical History:  Diagnosis Date  . Deep vein thrombosis [facility])     Past Surgical History:  Procedure Laterality Date  . ABDOMINAL HYSTERECTOMY    . 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 KNEE ARTHROSCOPY Right    right knee arthroscopy   . KNEE SURGERY      There were no vitals filed for this visit.   Subjective Assessment - 02/05/20 1440    Subjective  S: Last night it was worse but it's feeling better today.    Currently in Pain? Yes    Pain Score 2     Pain Location Shoulder    Pain Orientation Right    Pain Descriptors / Indicators Aching;Sore    Pain Type Acute pain    Pain Radiating Towards shoulder blade area    Pain Onset More than a month ago    Pain Frequency Intermittent    Aggravating Factors  unsure    Pain Relieving Factors topical medication, OTC medication, heat    Effect of Pain on Daily Activities mod effect on ADLs    Multiple Pain Sites No              [facility] OT Assessment - 02/05/20 1440      Assessment   Medical Diagnosis Right adhesive capsulitis/OA      Precautions   Precautions None                    OT  Treatments/Exercises (OP) - 02/05/20 1441      Exercises   Exercises Shoulder      Shoulder Exercises: Seated   Protraction AAROM;10 reps    External Rotation AROM;10 reps    Internal Rotation AROM;10 reps    Flexion AAROM;10 reps    Abduction AAROM;10 reps      Shoulder Exercises: Pulleys   Flexion 1 minute    Scaption 1 minute      Shoulder Exercises: ROM/Strengthening   Wall Wash 1'    Thumb Tacks 1'    Prot/Ret//Elev/Dep 1'      Manual Therapy   Manual Therapy Myofascial release    Manual therapy comments manual therapy completed seperately from all other interventions this date of service  completed seated     Myofascial Release myofascial release and manual stretching to right upper arm, scapular, and shoulder region to decrease pain and fascial restrictions and improve pain free moblity in her right shoulder region  OT Short Term Goals - 01/28/20 1655      OT SHORT TERM GOAL #1   Title Pt will be provided with and educated on HEP to improve mobility of RUE required for use during daily tasks.    Time 3    Period Weeks    Status On-going    Target Date 02/10/20      OT SHORT TERM GOAL #2   Title Pt will report a decrease in pain in RUE to 3/10 or less while completing daily self-care tasks such as making her bed.    Time 3    Period Weeks    Status On-going      OT SHORT TERM GOAL #3   Title Pt will increase RUE strength to 3+/5 or greater to improve ability to use LUE as dominant while carrying items 5 pounds or lighter.    Time 3    Period Weeks    Status On-going      OT SHORT TERM GOAL #4   Title Patient will increase P/ROM to [facility] to increase her mobility for functional reaching tasks at shoulder height.    Time 3    Period Weeks    Status On-going             OT Long Term Goals - 01/28/20 1655      OT LONG TERM GOAL #1   Title Patient will report using her RUE as dominant, in 90% or more of her daily functional  activities.    Status On-going      OT LONG TERM GOAL #2   Title Patient will decrease fascial restrictions to min amount, in order to increase her mobility in a pain free zone.    Status On-going      OT LONG TERM GOAL #3   Title Patient will improve her A/ROM to as WNL as possible to promote her independence with completing bathing and dressing tasks.    Status On-going      OT LONG TERM GOAL #4   Title Patient will increase her strength to at least 4/5 to allow her to use her RUE as dominant with getting into her SUV    Status On-going                 Plan - 02/05/20 1458    Clinical Impression Statement A: Pt reports less popping this morning when completing exercises. Continued with manual therapy in sitting, completed passive stretching to 50% ROM. Continued with AA/ROM working towards increasing ROM tolerance. Minimal popping noted today during exercises. Added thumb tacks and prot/ret/elev/dep, also added pulleys with good stretch and no popping. Verbal cuing for form and technique.    Body Structure / Function / Physical Skills ADL;Endurance;Muscle spasms;UE functional use;Fascial restriction;Body mechanics;Flexibility;Pain;ROM;Strength    Plan P: Continue with pulleys, attempt therapy ball circles    OT Home Exercise Plan Eval: table slides, 10/13:  aa/rom in supine flexion and protraction and information on seat cushions and aides to get in and out of car.    Consulted and Agree with Plan of Care Patient           Patient will benefit from skilled therapeutic intervention in order to improve the following deficits and impairments:   Body Structure / Function / Physical Skills: ADL, Endurance, Muscle spasms, UE functional use, Fascial restriction, Body mechanics, Flexibility, Pain, ROM, Strength       Visit Diagnosis: Chronic right shoulder pain  Stiffness of right shoulder,  not elsewhere classified  Other symptoms and signs involving the musculoskeletal  system    Problem List There are no problems to display for this patient.  Guadelupe Sabin, OTR/L  [phone] 02/05/2020, 3:15 PM  [place] [address] [place], [place], [address] Phone: [phone]   Fax:  [phone]  Name: Meredith Warner MRN: [id number] Date of Birth: 1950-09-16

## 2020-02-11 ENCOUNTER — Encounter (HOSPITAL_COMMUNITY): Payer: Self-pay

## 2020-02-11 ENCOUNTER — Ambulatory Visit (HOSPITAL_COMMUNITY): Payer: Medicare HMO

## 2020-02-11 ENCOUNTER — Other Ambulatory Visit: Payer: Self-pay

## 2020-02-11 DIAGNOSIS — G8929 Other chronic pain: Secondary | ICD-10-CM

## 2020-02-11 DIAGNOSIS — M25611 Stiffness of right shoulder, not elsewhere classified: Secondary | ICD-10-CM

## 2020-02-11 DIAGNOSIS — R29898 Other symptoms and signs involving the musculoskeletal system: Secondary | ICD-10-CM

## 2020-02-11 DIAGNOSIS — M25511 Pain in right shoulder: Secondary | ICD-10-CM | POA: Diagnosis not present

## 2020-02-11 NOTE — Therapy (Addendum)
[place], [place], [address] Phone: [phone]   Fax:  [phone]  Occupational Therapy Treatment  Patient Details  Name: Meredith Warner MRN: [id number] Date of Birth: 1950/07/06 Referring Provider (OT): Arther Abbott, MD   Encounter Date: 02/11/2020   OT End of Session - 02/11/20 1330    Visit Number 6    Number of Visits 12    Date for OT Re-Evaluation 03/02/20    Authorization Type [employer], $40 copay, no authorized visits    Progress Note Due on Visit 10    OT Start Time 3419   pt arrived late   OT Stop Time 1342    OT Time Calculation (min) 38 min    Activity Tolerance Patient limited by pain;Patient tolerated treatment well    Behavior During Therapy [facility] for tasks assessed/performed           Past Medical History:  Diagnosis Date  . Deep vein thrombosis [facility])     Past Surgical History:  Procedure Laterality Date  . ABDOMINAL HYSTERECTOMY    . 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 KNEE ARTHROSCOPY Right    right knee arthroscopy   . KNEE SURGERY      There were no vitals filed for this visit.   Subjective Assessment - 02/11/20 1307    Subjective  S: I've been doing better, I have been able to wash clothes and put on different shoes    Currently in Pain? Yes    Pain Score 1     Pain Location Shoulder    Pain Orientation Right    Pain Descriptors / Indicators Sore    Pain Type Acute pain    Pain Radiating Towards shoulder blade area    Pain Onset More than a month ago    Pain Frequency Intermittent    Aggravating Factors  unsure    Pain Relieving Factors topical meds, OTC meds, heat    Effect of Pain on Daily Activities min-mod    Multiple Pain Sites No              [facility] OT Assessment - 02/11/20 1344      Assessment   Medical Diagnosis Right adhesive capsulitis/OA      Precautions   Precautions None                    OT  Treatments/Exercises (OP) - 02/11/20 1310      Exercises   Exercises Shoulder      Shoulder Exercises: Seated   External Rotation AROM;10 reps    Internal Rotation AROM;10 reps    Flexion AAROM;10 reps    Abduction AAROM;10 reps      Shoulder Exercises: Pulleys   Flexion 1 minute    Scaption 1 minute      Shoulder Exercises: Therapy Ball   Other Therapy Ball Exercises Circles both directions, 10X each using both hands      Shoulder Exercises: ROM/Strengthening   Thumb Tacks 1'    Prot/Ret//Elev/Dep 1'      Manual Therapy   Manual Therapy Myofascial release    Manual therapy comments manual therapy completed seperately from all other interventions this date of service  completed seated     Myofascial Release myofascial release and manual stretching to right upper arm, scapular, and shoulder region to decrease pain and fascial restrictions and improve pain free moblity in her right shoulder region  OT Short Term Goals - 01/28/20 1655      OT SHORT TERM GOAL #1   Title Pt will be provided with and educated on HEP to improve mobility of RUE required for use during daily tasks.    Time 3    Period Weeks    Status On-going    Target Date 02/10/20      OT SHORT TERM GOAL #2   Title Pt will report a decrease in pain in RUE to 3/10 or less while completing daily self-care tasks such as making her bed.    Time 3    Period Weeks    Status On-going      OT SHORT TERM GOAL #3   Title Pt will increase RUE strength to 3+/5 or greater to improve ability to use LUE as dominant while carrying items 5 pounds or lighter.    Time 3    Period Weeks    Status On-going      OT SHORT TERM GOAL #4   Title Patient will increase P/ROM to [facility] to increase her mobility for functional reaching tasks at shoulder height.    Time 3    Period Weeks    Status On-going             OT Long Term Goals - 02/11/20 1418      OT LONG TERM GOAL #1   Title Patient will  report using her RUE as dominant, in 90% or more of her daily functional activities.    Time 6    Period Weeks    Status On-going    Target Date 03/02/20      OT LONG TERM GOAL #2   Title Patient will decrease fascial restrictions to min amount, in order to increase her mobility in a pain free zone.    Time 6    Period Weeks    Status On-going      OT LONG TERM GOAL #3   Title Patient will improve her A/ROM to as WNL as possible to promote her independence with completing bathing and dressing tasks.    Time 6    Period Weeks    Status On-going      OT LONG TERM GOAL #4   Title Patient will increase her strength to at least 4/5 to allow her to use her RUE as dominant with getting into her SUV    Time 6    Period Weeks    Status On-going                 Plan - 02/11/20 1331    Clinical Impression Statement A: Patient reporting improvement in pain and ability to move her shoulder along with picking up a 10# box this weekend. Continued with manual techniques to address scapular fascial restrictions. Continued with A/ROM and AA/ROM seated, with most discomfort reported with abduction AA/ROM. Progressed to using therapy ball for circles both directions with no increase in pain. Continued pulleys with VC and tactile cues required for positioning and form.    Body Structure / Function / Physical Skills ADL;Endurance;Muscle spasms;UE functional use;Fascial restriction;Body mechanics;Flexibility;Pain;ROM;Strength    Plan P: Continue pulleys, attempt protraction with volleyball or basketball    Consulted and Agree with Plan of Care Patient           Patient will benefit from skilled therapeutic intervention in order to improve the following deficits and impairments:   Body Structure / Function / Physical Skills: ADL, Endurance, Muscle spasms,  UE functional use, Fascial restriction, Body mechanics, Flexibility, Pain, ROM, Strength       Visit Diagnosis: Chronic right shoulder  pain  Stiffness of right shoulder, not elsewhere classified  Other symptoms and signs involving the musculoskeletal system    Problem List There are no problems to display for this patient.    [place], [place] Student [phone]   02/11/2020, 2:20 PM  [place], [place], [address] Phone: [phone]   Fax:  [phone]  Name: Meredith Warner MRN: [id number] Date of Birth: 1950/05/19

## 2020-02-13 ENCOUNTER — Other Ambulatory Visit: Payer: Self-pay

## 2020-02-13 ENCOUNTER — Encounter (HOSPITAL_COMMUNITY): Payer: Self-pay | Admitting: Occupational Therapy

## 2020-02-13 ENCOUNTER — Ambulatory Visit (HOSPITAL_COMMUNITY): Payer: Medicare HMO | Admitting: Occupational Therapy

## 2020-02-13 DIAGNOSIS — M25511 Pain in right shoulder: Secondary | ICD-10-CM

## 2020-02-13 DIAGNOSIS — G8929 Other chronic pain: Secondary | ICD-10-CM

## 2020-02-13 DIAGNOSIS — R29898 Other symptoms and signs involving the musculoskeletal system: Secondary | ICD-10-CM

## 2020-02-13 DIAGNOSIS — M25611 Stiffness of right shoulder, not elsewhere classified: Secondary | ICD-10-CM | POA: Diagnosis not present

## 2020-02-13 NOTE — Therapy (Signed)
[facility] [address] [place], [place], [address] Phone: [phone]   Fax:  [phone]  Occupational Therapy Treatment  Patient Details  Name: Meredith Warner MRN: [id number] Date of Birth: 05-May-1950 Referring Provider (OT): Fuller Canada, MD   Encounter Date: 02/13/2020   OT End of Session - 02/13/20 1510    Visit Number 7    Number of Visits 12    Date for OT Re-Evaluation 03/02/20    Authorization Type [employer], $40 copay, no authorized visits    Progress Note Due on Visit 10    OT Start Time 1430    OT Stop Time 1509    OT Time Calculation (min) 39 min    Activity Tolerance Patient limited by pain;Patient tolerated treatment well    Behavior During Therapy [facility] for tasks assessed/performed           Past Medical History:  Diagnosis Date  . Deep vein thrombosis [facility])     Past Surgical History:  Procedure Laterality Date  . ABDOMINAL HYSTERECTOMY    . 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 KNEE ARTHROSCOPY Right    right knee arthroscopy   . KNEE SURGERY      There were no vitals filed for this visit.   Subjective Assessment - 02/13/20 1431    Subjective  S: I think I've felt more in my shoulder blade area due to the weather.    Currently in Pain? Yes    Pain Score 2     Pain Location Shoulder    Pain Orientation Right    Pain Descriptors / Indicators Sore    Pain Type Acute pain    Pain Radiating Towards shoulder blade area    Pain Onset More than a month ago    Pain Frequency Intermittent    Aggravating Factors  weather    Pain Relieving Factors topical meds, OTC meds, heat    Effect of Pain on Daily Activities min/mod effect on ADLs    Multiple Pain Sites No              [facility] OT Assessment - 02/13/20 1431      Assessment   Medical Diagnosis Right adhesive capsulitis/OA      Precautions   Precautions None                    OT Treatments/Exercises (OP) -  02/13/20 1432      Exercises   Exercises Shoulder      Shoulder Exercises: Seated   Extension Theraband;10 reps    Theraband Level (Shoulder Extension) Level 2 (Red)    Row Theraband;10 reps    Theraband Level (Shoulder Row) Level 2 (Red)    Protraction AAROM;10 reps    External Rotation AROM;10 reps    Internal Rotation AROM;10 reps    Flexion AAROM;10 reps    Abduction AAROM;10 reps      Shoulder Exercises: Pulleys   Flexion 1 minute    Scaption 1 minute      Shoulder Exercises: Therapy Ball   Other Therapy Ball Exercises Circles both directions, 10X each using orange volleyball      Shoulder Exercises: ROM/Strengthening   UBE (Upper Arm Bike) Level 1 2' forward 2' reverse, pace: 2.5    Other ROM/Strengthening Exercises reverse shoulder rolls: 10X      Manual Therapy   Manual Therapy Myofascial release    Manual therapy comments manual  therapy completed seperately from all other interventions this date of service  completed seated     Myofascial Release myofascial release and manual stretching to right upper arm, scapular, and shoulder region to decrease pain and fascial restrictions and improve pain free moblity in her right shoulder region                    OT Short Term Goals - 01/28/20 1655      OT SHORT TERM GOAL #1   Title Pt will be provided with and educated on HEP to improve mobility of RUE required for use during daily tasks.    Time 3    Period Weeks    Status On-going    Target Date 02/10/20      OT SHORT TERM GOAL #2   Title Pt will report a decrease in pain in RUE to 3/10 or less while completing daily self-care tasks such as making her bed.    Time 3    Period Weeks    Status On-going      OT SHORT TERM GOAL #3   Title Pt will increase RUE strength to 3+/5 or greater to improve ability to use LUE as dominant while carrying items 5 pounds or lighter.    Time 3    Period Weeks    Status On-going      OT SHORT TERM GOAL #4   Title  Patient will increase P/ROM to [facility] to increase her mobility for functional reaching tasks at shoulder height.    Time 3    Period Weeks    Status On-going             OT Long Term Goals - 02/11/20 1418      OT LONG TERM GOAL #1   Title Patient will report using her RUE as dominant, in 90% or more of her daily functional activities.    Time 6    Period Weeks    Status On-going    Target Date 03/02/20      OT LONG TERM GOAL #2   Title Patient will decrease fascial restrictions to min amount, in order to increase her mobility in a pain free zone.    Time 6    Period Weeks    Status On-going      OT LONG TERM GOAL #3   Title Patient will improve her A/ROM to as WNL as possible to promote her independence with completing bathing and dressing tasks.    Time 6    Period Weeks    Status On-going      OT LONG TERM GOAL #4   Title Patient will increase her strength to at least 4/5 to allow her to use her RUE as dominant with getting into her SUV    Time 6    Period Weeks    Status On-going                 Plan - 02/13/20 1444    Clinical Impression Statement A: Pt reports her RUE has been feeling pretty good for the last few days until last night. Pt reports slight increase in pain since yesterday. She bought a heating pad and a device to assist with getting into and out of her car. Continued with manual techniques, pt continues to have max fascial restrictions along upper trapezius down into scapular regions. Continued with AA/ROM in sitting, continue to note significant amounts of creptius during ROM tasks. Added UBE and  scapular theraband today, verbal cuing for form during theraband tasks.    Body Structure / Function / Physical Skills ADL;Endurance;Muscle spasms;UE functional use;Fascial restriction;Body mechanics;Flexibility;Pain;ROM;Strength    Plan P: Continue working on scapular mobility and posture, continue with therabands working towards greater independence in form     OT Home Exercise Plan Eval: table slides, 10/13:  aa/rom in supine flexion and protraction and information on seat cushions and aides to get in and out of car.    Consulted and Agree with Plan of Care Patient           Patient will benefit from skilled therapeutic intervention in order to improve the following deficits and impairments:   Body Structure / Function / Physical Skills: ADL, Endurance, Muscle spasms, UE functional use, Fascial restriction, Body mechanics, Flexibility, Pain, ROM, Strength       Visit Diagnosis: Chronic right shoulder pain  Stiffness of right shoulder, not elsewhere classified  Other symptoms and signs involving the musculoskeletal system    Problem List There are no problems to display for this patient.  Ezra Sites, OTR/L  [phone] 02/13/2020, 3:12 PM  [place] [facility] [address] [place], [place], [address] Phone: [phone]   Fax:  [phone]  Name: Meredith Warner MRN: [id number] Date of Birth: 1950-09-18

## 2020-02-17 ENCOUNTER — Encounter (HOSPITAL_COMMUNITY): Payer: Self-pay | Admitting: Occupational Therapy

## 2020-02-17 ENCOUNTER — Other Ambulatory Visit: Payer: Self-pay

## 2020-02-17 ENCOUNTER — Ambulatory Visit (HOSPITAL_COMMUNITY): Payer: Medicare HMO | Attending: Orthopedic Surgery | Admitting: Occupational Therapy

## 2020-02-17 DIAGNOSIS — G8929 Other chronic pain: Secondary | ICD-10-CM | POA: Insufficient documentation

## 2020-02-17 DIAGNOSIS — M25611 Stiffness of right shoulder, not elsewhere classified: Secondary | ICD-10-CM | POA: Diagnosis not present

## 2020-02-17 DIAGNOSIS — M25511 Pain in right shoulder: Secondary | ICD-10-CM | POA: Insufficient documentation

## 2020-02-17 DIAGNOSIS — R29898 Other symptoms and signs involving the musculoskeletal system: Secondary | ICD-10-CM | POA: Diagnosis not present

## 2020-02-17 NOTE — Therapy (Signed)
[facility] [address] [place], [place], [address] Phone: [phone]   Fax:  [phone]  Occupational Therapy Treatment  Patient Details  Name: Meredith Warner MRN: [id number] Date of Birth: 1950-11-27 Referring Provider (OT): Fuller Canada, MD   Encounter Date: 02/17/2020   OT End of Session - 02/17/20 1647    Visit Number 8    Number of Visits 12    Date for OT Re-Evaluation 03/02/20    Authorization Type [employer], $40 copay, no authorized visits    Progress Note Due on Visit 10    OT Start Time 1438    OT Stop Time 1513    OT Time Calculation (min) 35 min    Activity Tolerance Patient limited by pain;Patient tolerated treatment well    Behavior During Therapy [facility] for tasks assessed/performed           Past Medical History:  Diagnosis Date  . Deep vein thrombosis [facility])     Past Surgical History:  Procedure Laterality Date  . ABDOMINAL HYSTERECTOMY    . 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 KNEE ARTHROSCOPY Right    right knee arthroscopy   . KNEE SURGERY      There were no vitals filed for this visit.   Subjective Assessment - 02/17/20 1438    Subjective  S: It started feeling worse on Friday, I took my trash out for the first time that day.    Currently in Pain? Yes    Pain Score 4     Pain Location Shoulder    Pain Orientation Right    Pain Descriptors / Indicators Aching;Sore    Pain Type Acute pain    Pain Radiating Towards shoulder blade area    Pain Onset More than a month ago    Pain Frequency Intermittent    Aggravating Factors  weather    Pain Relieving Factors topical meds, OTC meds, heat    Effect of Pain on Daily Activities min/mod effect on ADLs              [facility] OT Assessment - 02/17/20 1438      Assessment   Medical Diagnosis Right adhesive capsulitis/OA      Precautions   Precautions None                    OT Treatments/Exercises (OP) - 02/17/20  1439      Exercises   Exercises Shoulder      Shoulder Exercises: Seated   Extension Theraband;10 reps    Theraband Level (Shoulder Extension) Level 2 (Red)    Row Theraband;10 reps    Theraband Level (Shoulder Row) Level 2 (Red)    Protraction AAROM;10 reps    Flexion AAROM;10 reps    Abduction AROM;10 reps      Functional Reaching Activities   Low Level Pt placing 10 cones onto bottom shelf of overhead cabinet in flexion, then removing.       Modalities   Modalities Ultrasound      Ultrasound   Ultrasound Location upper trapezius    Ultrasound Parameters 1.5 W/cm2    Ultrasound Goals Pain      Manual Therapy   Manual Therapy Myofascial release    Manual therapy comments manual therapy completed seperately from all other interventions this date of service  completed seated     Myofascial Release myofascial release and manual stretching to right upper arm, scapular,  and shoulder region to decrease pain and fascial restrictions and improve pain free moblity in her right shoulder region                    OT Short Term Goals - 01/28/20 1655      OT SHORT TERM GOAL #1   Title Pt will be provided with and educated on HEP to improve mobility of RUE required for use during daily tasks.    Time 3    Period Weeks    Status On-going    Target Date 02/10/20      OT SHORT TERM GOAL #2   Title Pt will report a decrease in pain in RUE to 3/10 or less while completing daily self-care tasks such as making her bed.    Time 3    Period Weeks    Status On-going      OT SHORT TERM GOAL #3   Title Pt will increase RUE strength to 3+/5 or greater to improve ability to use LUE as dominant while carrying items 5 pounds or lighter.    Time 3    Period Weeks    Status On-going      OT SHORT TERM GOAL #4   Title Patient will increase P/ROM to [facility] to increase her mobility for functional reaching tasks at shoulder height.    Time 3    Period Weeks    Status On-going              OT Long Term Goals - 02/11/20 1418      OT LONG TERM GOAL #1   Title Patient will report using her RUE as dominant, in 90% or more of her daily functional activities.    Time 6    Period Weeks    Status On-going    Target Date 03/02/20      OT LONG TERM GOAL #2   Title Patient will decrease fascial restrictions to min amount, in order to increase her mobility in a pain free zone.    Time 6    Period Weeks    Status On-going      OT LONG TERM GOAL #3   Title Patient will improve her A/ROM to as WNL as possible to promote her independence with completing bathing and dressing tasks.    Time 6    Period Weeks    Status On-going      OT LONG TERM GOAL #4   Title Patient will increase her strength to at least 4/5 to allow her to use her RUE as dominant with getting into her SUV    Time 6    Period Weeks    Status On-going                 Plan - 02/17/20 1457    Clinical Impression Statement A: Pt reports using her arm more on Friday and over the weekend, therefore has been having more pain and notes pain that radiates from the upper trapezius down her arm. Added [place] for pain management today, continued with manual techniques and AA/ROM exercises. Pt continues to be limited to approximately 50% ROM. Added functional reaching task however pt limited due to painful popping and crepitus. Verbal cuing for form and technique.    Body Structure / Function / Physical Skills ADL;Endurance;Muscle spasms;UE functional use;Fascial restriction;Body mechanics;Flexibility;Pain;ROM;Strength    Plan P: Follow up on [place] impact on pain, continue working on scapular mobility and posture, progress note  OT Home Exercise Plan Eval: table slides, 10/13:  aa/rom in supine flexion and protraction and information on seat cushions and aides to get in and out of car.    Consulted and Agree with Plan of Care Patient           Patient will benefit from skilled therapeutic intervention in order to  improve the following deficits and impairments:   Body Structure / Function / Physical Skills: ADL, Endurance, Muscle spasms, UE functional use, Fascial restriction, Body mechanics, Flexibility, Pain, ROM, Strength       Visit Diagnosis: Chronic right shoulder pain  Stiffness of right shoulder, not elsewhere classified  Other symptoms and signs involving the musculoskeletal system    Problem List There are no problems to display for this patient.  Ezra Sites, OTR/L  [phone] 02/17/2020, 4:50 PM  [facility] [address] [place], [place], [address] Phone: [phone]   Fax:  [phone]  Name: KERIGAN NARVAEZ MRN: [id number] Date of Birth: 05/25/1950

## 2020-02-19 ENCOUNTER — Ambulatory Visit (HOSPITAL_COMMUNITY): Payer: Medicare HMO | Admitting: Occupational Therapy

## 2020-02-19 ENCOUNTER — Other Ambulatory Visit: Payer: Self-pay

## 2020-02-19 ENCOUNTER — Encounter (HOSPITAL_COMMUNITY): Payer: Self-pay | Admitting: Occupational Therapy

## 2020-02-19 DIAGNOSIS — G8929 Other chronic pain: Secondary | ICD-10-CM | POA: Diagnosis not present

## 2020-02-19 DIAGNOSIS — R29898 Other symptoms and signs involving the musculoskeletal system: Secondary | ICD-10-CM | POA: Diagnosis not present

## 2020-02-19 DIAGNOSIS — M25611 Stiffness of right shoulder, not elsewhere classified: Secondary | ICD-10-CM | POA: Diagnosis not present

## 2020-02-19 DIAGNOSIS — M25511 Pain in right shoulder: Secondary | ICD-10-CM | POA: Diagnosis not present

## 2020-02-19 NOTE — Therapy (Addendum)
[place], [place], [address] Phone: [phone]   Fax:  [phone]  Occupational Therapy Treatment  Patient Details  Name: Meredith Warner MRN: [id number] Date of Birth: 03/09/1951 Referring Provider (OT): Arther Abbott, MD   OCCUPATIONAL THERAPY DISCHARGE SUMMARY-05/27/20  Visits from Start of Care: 9  Current functional level related to goals / functional outcomes: Unknown. Pt has not returned to therapy after last treatment session on 02/19/2020.    Remaining deficits: Unknown.    Education / Equipment: HEP Plan: Patient agrees to discharge.  Patient goals were not met. Patient is being discharged due to meeting the stated rehab goals.  ?????       Encounter Date: 02/19/2020   OT End of Session - 02/19/20 1518    Visit Number 9    Number of Visits 12    Date for OT Re-Evaluation 03/02/20    Authorization Type [employer], $40 copay, no authorized visits    Progress Note Due on Visit 10    OT Start Time 1435    OT Stop Time 1514    OT Time Calculation (min) 39 min    Activity Tolerance Patient limited by pain;Patient tolerated treatment well    Behavior During Therapy [facility] for tasks assessed/performed           Past Medical History:  Diagnosis Date  . Deep vein thrombosis [facility])     Past Surgical History:  Procedure Laterality Date  . ABDOMINAL HYSTERECTOMY    . 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 KNEE ARTHROSCOPY Right    right knee arthroscopy   . KNEE SURGERY      There were no vitals filed for this visit.   Subjective Assessment - 02/19/20 1435    Subjective  S: My shoulder was perfect on Tuesday but it came back yesterday.    Currently in Pain? Yes    Pain Score 4     Pain Location Shoulder    Pain Orientation Right    Pain Descriptors / Indicators Aching;Sore    Pain Type Acute pain    Pain Radiating Towards shoulder blade area    Pain Onset More than a  month ago    Pain Frequency Intermittent    Aggravating Factors  weather    Pain Relieving Factors [place], topical meds, OTC meds, heat    Effect of Pain on Daily Activities min/mod effect on ADLs    Multiple Pain Sites No              [facility] OT Assessment - 02/19/20 1435      Assessment   Medical Diagnosis Right adhesive capsulitis/OA      Precautions   Precautions None                    OT Treatments/Exercises (OP) - 02/19/20 1437      Exercises   Exercises Shoulder      Shoulder Exercises: Seated   Row AROM;10 reps    Protraction AAROM;10 reps    External Rotation AROM;10 reps    Internal Rotation AROM;10 reps    Flexion AAROM;10 reps    Abduction AROM;10 reps      Functional Reaching Activities   Mid Level Pt completing pinch tree, placing 10 clothespins along vertical pole, then placing clothespins along top horizontal pole. Pt working on reaching into forward flexion      Modalities   Modalities  Ultrasound      Ultrasound   Ultrasound Location upper trapezius    Ultrasound Parameters 1.5 W/cm2    Ultrasound Goals Pain      Manual Therapy   Manual Therapy Myofascial release    Manual therapy comments manual therapy completed seperately from all other interventions this date of service  completed seated     Myofascial Release myofascial release and manual stretching to right upper arm, scapular, and shoulder region to decrease pain and fascial restrictions and improve pain free moblity in her right shoulder region                    OT Short Term Goals - 01/28/20 1655      OT SHORT TERM GOAL #1   Title Pt will be provided with and educated on HEP to improve mobility of RUE required for use during daily tasks.    Time 3    Period Weeks    Status On-going    Target Date 02/10/20      OT SHORT TERM GOAL #2   Title Pt will report a decrease in pain in RUE to 3/10 or less while completing daily self-care tasks such as making her bed.     Time 3    Period Weeks    Status On-going      OT SHORT TERM GOAL #3   Title Pt will increase RUE strength to 3+/5 or greater to improve ability to use LUE as dominant while carrying items 5 pounds or lighter.    Time 3    Period Weeks    Status On-going      OT SHORT TERM GOAL #4   Title Patient will increase P/ROM to [facility] to increase her mobility for functional reaching tasks at shoulder height.    Time 3    Period Weeks    Status On-going             OT Long Term Goals - 02/11/20 1418      OT LONG TERM GOAL #1   Title Patient will report using her RUE as dominant, in 90% or more of her daily functional activities.    Time 6    Period Weeks    Status On-going    Target Date 03/02/20      OT LONG TERM GOAL #2   Title Patient will decrease fascial restrictions to min amount, in order to increase her mobility in a pain free zone.    Time 6    Period Weeks    Status On-going      OT LONG TERM GOAL #3   Title Patient will improve her A/ROM to as WNL as possible to promote her independence with completing bathing and dressing tasks.    Time 6    Period Weeks    Status On-going      OT LONG TERM GOAL #4   Title Patient will increase her strength to at least 4/5 to allow her to use her RUE as dominant with getting into her SUV    Time 6    Period Weeks    Status On-going                 Plan - 02/19/20 1500    Clinical Impression Statement A: Pt reports no pain after [place] on Tuesday, however pain returned yesterday. Continued with manual techniques and [place] at beginning of session. Continued with AA/ROM in sitting, OT noting smoother movements during exercises.  Continued with functional reaching tasks, pt reaching to 90 degrees before popping and crepitus began. Verbal cuing for form and technique.    Body Structure / Function / Physical Skills ADL;Endurance;Muscle spasms;UE functional use;Fascial restriction;Body mechanics;Flexibility;Pain;ROM;Strength    Plan P:  Continue with [place], functional reaching, scapular mobility and AA/ROM progressing to A/ROM    OT Home Exercise Plan Eval: table slides, 10/13:  aa/rom in supine flexion and protraction and information on seat cushions and aides to get in and out of car.    Consulted and Agree with Plan of Care Patient           Patient will benefit from skilled therapeutic intervention in order to improve the following deficits and impairments:   Body Structure / Function / Physical Skills: ADL, Endurance, Muscle spasms, UE functional use, Fascial restriction, Body mechanics, Flexibility, Pain, ROM, Strength       Visit Diagnosis: Chronic right shoulder pain  Stiffness of right shoulder, not elsewhere classified  Other symptoms and signs involving the musculoskeletal system    Problem List There are no problems to display for this patient.  Guadelupe Sabin, OTR/L  [phone] 02/19/2020, 3:19 PM  [facility] [address] [place], [place], [address] Phone: [phone]   Fax:  [phone]  Name: Meredith Warner MRN: [id number] Date of Birth: 25-Jan-1951

## 2020-02-26 DIAGNOSIS — Z6841 Body Mass Index (BMI) 40.0 and over, adult: Secondary | ICD-10-CM | POA: Diagnosis not present

## 2020-02-26 DIAGNOSIS — I10 Essential (primary) hypertension: Secondary | ICD-10-CM | POA: Diagnosis not present

## 2020-02-26 DIAGNOSIS — E7849 Other hyperlipidemia: Secondary | ICD-10-CM | POA: Diagnosis not present

## 2020-02-26 DIAGNOSIS — M170 Bilateral primary osteoarthritis of knee: Secondary | ICD-10-CM | POA: Diagnosis not present

## 2020-02-26 DIAGNOSIS — M25511 Pain in right shoulder: Secondary | ICD-10-CM | POA: Diagnosis not present

## 2020-03-17 ENCOUNTER — Ambulatory Visit (INDEPENDENT_AMBULATORY_CARE_PROVIDER_SITE_OTHER): Payer: Medicare HMO | Admitting: Orthopedic Surgery

## 2020-03-17 ENCOUNTER — Telehealth: Payer: Self-pay | Admitting: Radiology

## 2020-03-17 ENCOUNTER — Encounter: Payer: Self-pay | Admitting: Orthopedic Surgery

## 2020-03-17 ENCOUNTER — Other Ambulatory Visit: Payer: Self-pay

## 2020-03-17 DIAGNOSIS — G8929 Other chronic pain: Secondary | ICD-10-CM | POA: Diagnosis not present

## 2020-03-17 DIAGNOSIS — M19011 Primary osteoarthritis, right shoulder: Secondary | ICD-10-CM

## 2020-03-17 DIAGNOSIS — Z6841 Body Mass Index (BMI) 40.0 and over, adult: Secondary | ICD-10-CM

## 2020-03-17 DIAGNOSIS — M7501 Adhesive capsulitis of right shoulder: Secondary | ICD-10-CM

## 2020-03-17 DIAGNOSIS — M25511 Pain in right shoulder: Secondary | ICD-10-CM

## 2020-03-17 MED ORDER — TRAMADOL-ACETAMINOPHEN 37.5-325 MG PO TABS: 1.0000 | ORAL_TABLET | ORAL | 5 refills | Status: DC | PRN

## 2020-03-17 NOTE — Telephone Encounter (Signed)
Call to schedule injection at [facility] right shoulder

## 2020-03-17 NOTE — Patient Instructions (Signed)
You will get a call about the injection, it will be done at [facility]

## 2020-03-17 NOTE — Progress Notes (Signed)
Chief Complaint  Patient presents with  . Shoulder Pain    right shoulder    Encounter Diagnoses  Name Primary?  . Morbid obesity (HCC) Yes  . Body mass index 50.0-59.9, adult (HCC)   . Primary osteoarthritis, right shoulder   . Adhesive capsulitis of right shoulder   . Chronic pain in right shoulder     69 year old female injured her shoulder pulling up on a recliner she has arthritis of the glenohumeral joint with large inferior osteophyte she has chronic medical problems and she has obesity  She was treated with naproxen physical therapy subacromial injection she is here for follow-up  She still having significant shoulder pain loss of motion and loss of function  She tells [place] she is allergic to codeine and that the naproxen because her blood pressure to go up  Active range of motion abduction 45 degrees flexion 40 degrees Extension 30 degrees but internal rotation only to the right hip pocket  The cuff is essentially nonfunctional  X-rays show severe arthritis of the right shoulder multiple osteophytes around the inferior portion of the joint  She would like to consider an intra-articular injection She applied for a study regarding platelet rich plasma but was denied because of her obesity  So we are going to take her off the naproxen Start the Ultracet Get an intra-articular injection Return 1 month after  Past Medical History:  Diagnosis Date  . Deep vein thrombosis (HCC)   . High blood pressure    Past Surgical History:  Procedure Laterality Date  . ABDOMINAL HYSTERECTOMY    . 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 KNEE ARTHROSCOPY Right    right knee arthroscopy   . KNEE SURGERY

## 2020-03-17 NOTE — Telephone Encounter (Signed)
I called and phone call was disconnected Have sent Email to Toni  

## 2020-03-17 NOTE — Telephone Encounter (Signed)
03/22/20 9am

## 2020-03-22 ENCOUNTER — Ambulatory Visit (HOSPITAL_COMMUNITY): Payer: Medicare HMO

## 2020-03-29 ENCOUNTER — Ambulatory Visit (HOSPITAL_COMMUNITY)
Admission: RE | Admit: 2020-03-29 | Discharge: 2020-03-29 | Disposition: A | Discharge status: Home / Self Care | Payer: Medicare HMO | Source: Ambulatory Visit | Attending: Orthopedic Surgery | Admitting: Orthopedic Surgery

## 2020-03-29 ENCOUNTER — Encounter (HOSPITAL_COMMUNITY): Payer: Self-pay

## 2020-03-29 ENCOUNTER — Other Ambulatory Visit: Payer: Self-pay

## 2020-03-29 DIAGNOSIS — M19011 Primary osteoarthritis, right shoulder: Secondary | ICD-10-CM | POA: Insufficient documentation

## 2020-03-29 DIAGNOSIS — M7501 Adhesive capsulitis of right shoulder: Secondary | ICD-10-CM | POA: Diagnosis not present

## 2020-03-29 MED ORDER — POVIDONE-IODINE 10 % EX SOLN
CUTANEOUS | Status: AC
  Administered 2020-03-29: 1
  Filled 2020-03-29: qty 15

## 2020-03-29 MED ORDER — LIDOCAINE HCL (PF) 1 % IJ SOLN
INTRAMUSCULAR | Status: AC
  Administered 2020-03-29: 3 mL via INTRA_ARTICULAR
  Filled 2020-03-29: qty 5

## 2020-03-29 MED ORDER — LIDOCAINE HCL (PF) 2 % IJ SOLN
INTRAMUSCULAR | Status: AC
  Administered 2020-03-29: 3 mL via INTRA_ARTICULAR
  Filled 2020-03-29: qty 10

## 2020-03-29 MED ORDER — METHYLPREDNISOLONE ACETATE 40 MG/ML IJ SUSP
INTRAMUSCULAR | Status: AC
  Administered 2020-03-29: 40 mg via INTRA_ARTICULAR
  Filled 2020-03-29: qty 1

## 2020-03-29 MED ORDER — IOHEXOL 300 MG/ML  SOLN
50.0000 mL | Freq: Once | INTRAMUSCULAR | Status: AC | PRN
  Administered 2020-03-29: 4 mL via INTRA_ARTICULAR

## 2020-03-29 NOTE — Procedures (Signed)
Preprocedure Dx: Adhesive capsulitis of right shoulder Postprocedure Dx: Adhesive capsulitis of right shoulder Procedure  Fluoroscopically guided RIGHT shoulder joint therapeutic injection Radiologist:  Tyron Russell Anesthesia:  3 ml of 2% lidocaine Injectate:  40 mg DepoMedrol, 3 ml of 1% lidocaine Fluoro time:  2 minutes 36 seconds EBL:   None Complications: None

## 2020-04-15 ENCOUNTER — Encounter: Payer: Self-pay | Admitting: Orthopedic Surgery

## 2020-04-15 ENCOUNTER — Ambulatory Visit: Payer: Medicare HMO | Admitting: Orthopedic Surgery

## 2020-04-15 ENCOUNTER — Other Ambulatory Visit: Payer: Self-pay

## 2020-04-15 VITALS — BP 131/72 | HR 66 | Ht 69.0 in

## 2020-04-15 DIAGNOSIS — Z6841 Body Mass Index (BMI) 40.0 and over, adult: Secondary | ICD-10-CM

## 2020-04-15 DIAGNOSIS — M19011 Primary osteoarthritis, right shoulder: Secondary | ICD-10-CM | POA: Diagnosis not present

## 2020-04-15 NOTE — Progress Notes (Signed)
69 year old female injured her left shoulder pulling up on a recliner.  She has arthritis in the glenohumeral joint with large inferior osteophytes.  She does have obesity and chronic medical problems she has been treated with physical therapy subacromial injection and intra-articular injection and presents in follow-up to discuss further treatment options and to determine if the injection helped at all.   She tells [place] she is allergic to codeine and that the naproxen because her blood pressure to go up   Active range of motion abduction 45 degrees flexion 40 degrees Extension 30 degrees but internal rotation only to the right hip pocket   She got relief only for 3 days with intra-articular injection.  She considered platelet rich plasma but that is really experimental.  Her real chance at improved function in a improved pain is to replace the shoulder  She is agreeable to see Dr. [place] Schimke for possible replacement

## 2020-04-27 ENCOUNTER — Telehealth: Payer: Self-pay | Admitting: Orthopedic Surgery

## 2020-04-27 ENCOUNTER — Ambulatory Visit: Payer: Medicare HMO | Admitting: Orthopedic Surgery

## 2020-04-27 NOTE — Telephone Encounter (Signed)
Patient relayed she has laryngitis; therefore, was unable to talk on phone earlier today to cancel appointment. She has rescheduled her visit.

## 2020-04-28 NOTE — Telephone Encounter (Signed)
Noted  

## 2020-05-11 ENCOUNTER — Other Ambulatory Visit: Payer: Self-pay

## 2020-05-11 ENCOUNTER — Encounter: Payer: Self-pay | Admitting: Orthopedic Surgery

## 2020-05-11 ENCOUNTER — Ambulatory Visit (INDEPENDENT_AMBULATORY_CARE_PROVIDER_SITE_OTHER): Payer: Medicare HMO | Admitting: Orthopedic Surgery

## 2020-05-11 VITALS — BP 135/81 | HR 74 | Ht 69.0 in | Wt 330.0 lb

## 2020-05-11 DIAGNOSIS — Z6841 Body Mass Index (BMI) 40.0 and over, adult: Secondary | ICD-10-CM

## 2020-05-11 DIAGNOSIS — M19011 Primary osteoarthritis, right shoulder: Secondary | ICD-10-CM | POA: Diagnosis not present

## 2020-05-11 NOTE — Patient Instructions (Signed)
We discussed reverse shoulder arthroplasty in clinic today.  If you are interested in proceeding, you will need to obtain medical clearance from your regular doctor and a CT scan prior to surgery.  Otherwise, you can continue with current medications.   Please contact the clinic if you would like to proceed with surgery, or if you have any further questions.

## 2020-05-11 NOTE — Progress Notes (Signed)
New Patient Visit  Assessment: Meredith Warner is a 70 y.o. RHD female with the following: Right shoulder glenohumeral arthritis  Plan: Meredith Warner has advanced right glenohumeral arthritis.  She has exhausted nonoperative treatment including multiple injections, therapy, oral medications and continues to have significant pain and dysfunction.  At this point, her options are limited to continued use of pain medications, injections and living with the pain versus possible surgical intervention.  If she wishes to pursue surgery, I have recommended reverse shoulder arthroplasty.  The procedure, expected recovery and plan for rehabilitation were discussed with the patient.   She will need to obtain medical clearance, and we will obtain a CT scan prior to surgery.  Due to her obesity, she is at an increased for postoperative complications including infection, dislocation and poor healing.  She stated her understanding.  She will contact the clinic if she has any further questions, or is interested in scheduling surgery.    The patient meets the AMA guidelines for Morbid obesity with BMI > 40.  The patient has been counseled on weight loss.    Follow-up: No follow-ups on file.  Subjective:  Chief Complaint  Patient presents with  . Shoulder Pain    Patient reports 8/10 pain level today, wants to discuss surgery,     History of Present Illness: Meredith Warner is a 70 y.o. RHD female who presents for evaluation of her right shoulder.  She has been having severe pain in her right shoulder since March, 2021.  Onset was following 2nd COVID vaccine or from forcefully pulling up on lever to extend her recliner.  She has had injections in her shoulder which improve her symptoms, but this has not resulted in sustained relief.  Pain is deep within the shoulder and radiates distally.  She has considered clinical trials for injections in her shoulder, but has not had anything yet.   Review of Systems: No  fevers or chills No numbness or tingling No chest pain No shortness of breath No bowel or bladder dysfunction No GI distress No headaches   Medical History:  Past Medical History:  Diagnosis Date  . Deep vein thrombosis (HCC)   . High blood pressure     Past Surgical History:  Procedure Laterality Date  . ABDOMINAL HYSTERECTOMY    . 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 KNEE ARTHROSCOPY Right    right knee arthroscopy   . KNEE SURGERY      Family History  Problem Relation Age of Onset  . Deep vein thrombosis Father    Social History   Tobacco Use  . Smoking status: Never Smoker  . Smokeless tobacco: Never Used    Allergies  Allergen Reactions  . Codeine Itching and Nausea And Vomiting    Current Meds  Medication Sig  . bisoprolol-hydrochlorothiazide (ZIAC) 10-6.25 MG tablet   . gabapentin (NEURONTIN) 400 MG capsule   . QUEtiapine (SEROQUEL) 100 MG tablet   . traMADol-acetaminophen (ULTRACET) 37.5-325 MG tablet Take 1 tablet by mouth every 4 (four) hours as needed.    Objective: BP 135/81   Pulse 74   Ht 5\' 9"  (1.753 m)   Wt (!) 330 lb (149.7 kg)   BMI 48.73 kg/m   Physical Exam:  General:  Obese female, no acute distress Gait: Slow  Right shoulder without deformity.  No acute injury.  Right hand to back pocket.  Forward flexion to 60 degrees.  Abduction at side to  60.  Negative belly press.  Fingers are warm and well perfused. Sensation intact distally.     IMAGING: I personally reviewed images previously obtained in clinic   XR of the right shoulder demonstrates severe gelnohumeral arthritis.  Inferior osteophytes, possible loose body in axillary recess.    New Medications:  No orders of the defined types were placed in this encounter.     Oliver Barre, MD  05/11/2020 12:56 PM

## 2020-05-12 ENCOUNTER — Other Ambulatory Visit: Payer: Self-pay | Admitting: Radiology

## 2020-05-12 DIAGNOSIS — M19011 Primary osteoarthritis, right shoulder: Secondary | ICD-10-CM

## 2020-05-12 MED ORDER — TRAMADOL-ACETAMINOPHEN 37.5-325 MG PO TABS: 1.0000 | ORAL_TABLET | ORAL | 5 refills | Status: DC | PRN

## 2020-05-12 NOTE — Telephone Encounter (Signed)
Refill request received via fax for Tramadol / to [facility] pharmacy

## 2020-05-27 DIAGNOSIS — Z6841 Body Mass Index (BMI) 40.0 and over, adult: Secondary | ICD-10-CM | POA: Diagnosis not present

## 2020-05-27 DIAGNOSIS — M170 Bilateral primary osteoarthritis of knee: Secondary | ICD-10-CM | POA: Diagnosis not present

## 2020-05-27 DIAGNOSIS — I10 Essential (primary) hypertension: Secondary | ICD-10-CM | POA: Diagnosis not present

## 2020-05-27 DIAGNOSIS — E7849 Other hyperlipidemia: Secondary | ICD-10-CM | POA: Diagnosis not present

## 2020-05-27 DIAGNOSIS — M25511 Pain in right shoulder: Secondary | ICD-10-CM | POA: Diagnosis not present

## 2020-08-26 DIAGNOSIS — Z6841 Body Mass Index (BMI) 40.0 and over, adult: Secondary | ICD-10-CM | POA: Diagnosis not present

## 2020-08-26 DIAGNOSIS — I10 Essential (primary) hypertension: Secondary | ICD-10-CM | POA: Diagnosis not present

## 2020-08-26 DIAGNOSIS — Z0000 Encounter for general adult medical examination without abnormal findings: Secondary | ICD-10-CM | POA: Diagnosis not present

## 2020-08-26 DIAGNOSIS — M170 Bilateral primary osteoarthritis of knee: Secondary | ICD-10-CM | POA: Diagnosis not present

## 2020-08-26 DIAGNOSIS — M25511 Pain in right shoulder: Secondary | ICD-10-CM | POA: Diagnosis not present

## 2020-08-26 DIAGNOSIS — E7849 Other hyperlipidemia: Secondary | ICD-10-CM | POA: Diagnosis not present

## 2020-10-02 ENCOUNTER — Other Ambulatory Visit: Payer: Self-pay | Admitting: Orthopedic Surgery

## 2020-10-02 DIAGNOSIS — M19011 Primary osteoarthritis, right shoulder: Secondary | ICD-10-CM

## 2020-10-12 DIAGNOSIS — Z23 Encounter for immunization: Secondary | ICD-10-CM | POA: Diagnosis not present

## 2021-02-08 DIAGNOSIS — F410 Panic disorder [episodic paroxysmal anxiety] without agoraphobia: Secondary | ICD-10-CM | POA: Diagnosis not present

## 2021-02-08 DIAGNOSIS — M170 Bilateral primary osteoarthritis of knee: Secondary | ICD-10-CM | POA: Diagnosis not present

## 2021-02-08 DIAGNOSIS — Z79899 Other long term (current) drug therapy: Secondary | ICD-10-CM | POA: Diagnosis not present

## 2021-02-08 DIAGNOSIS — F4541 Pain disorder exclusively related to psychological factors: Secondary | ICD-10-CM | POA: Diagnosis not present

## 2021-02-08 DIAGNOSIS — M25511 Pain in right shoulder: Secondary | ICD-10-CM | POA: Diagnosis not present

## 2021-02-08 DIAGNOSIS — I10 Essential (primary) hypertension: Secondary | ICD-10-CM | POA: Diagnosis not present

## 2021-02-08 DIAGNOSIS — E7849 Other hyperlipidemia: Secondary | ICD-10-CM | POA: Diagnosis not present

## 2021-02-08 DIAGNOSIS — Z0000 Encounter for general adult medical examination without abnormal findings: Secondary | ICD-10-CM | POA: Diagnosis not present

## 2021-02-08 DIAGNOSIS — Z6841 Body Mass Index (BMI) 40.0 and over, adult: Secondary | ICD-10-CM | POA: Diagnosis not present

## 2021-02-10 ENCOUNTER — Encounter (INDEPENDENT_AMBULATORY_CARE_PROVIDER_SITE_OTHER): Payer: Self-pay | Admitting: *Deleted

## 2021-03-16 IMAGING — RF DG FLUORO GUIDE NDL PLC/BX
2 series · 2 of 2 positions shown · non-contrast
Comparison: None

CLINICAL DATA: Adhesive capsulitis RIGHT shoulder

EXAM:
RIGHT shoulder INJECTION UNDER FLUOROSCOPY

[Series 1: cp_standard · 0.17mm/px · 1 of 1 slices shown (1 of 2)]
[im 1/1]
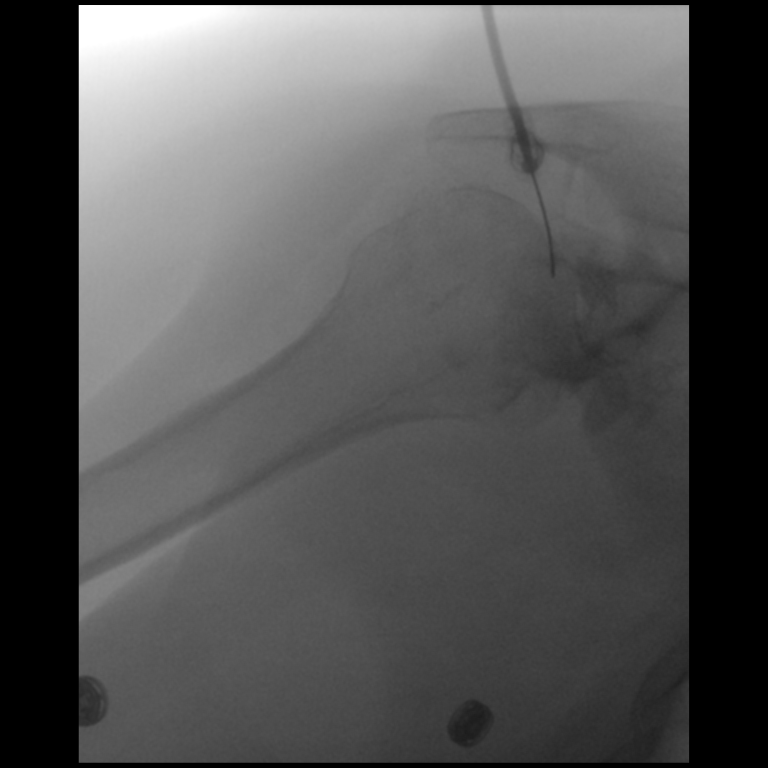

[Series 2: cp_standard · 0.17mm/px · 1 of 1 slices shown (2 of 2)]
[im 1/1]
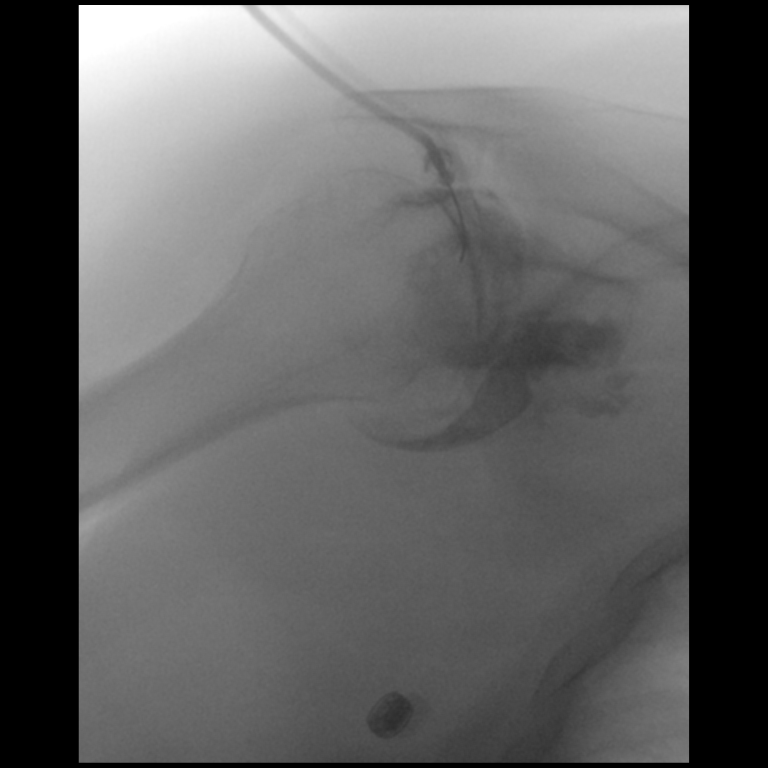

[2 of 2 positions shown; findings below may reference images not displayed]

FLUOROSCOPY TIME:  Fluoroscopy Time:  2 minutes 36 seconds

Radiation Exposure Index (if provided by the fluoroscopic device):
18 mGy

Number of Acquired Spot Images: 2

PROCEDURE:
Procedure, risks, benefits and alternatives explained to the
patient.

Patient's questions answered.

Written informed consent obtained.

Timeout protocol followed.

RIGHT shoulder joint localized by fluoroscopy.

Skin prepped and draped in usual sterile fashion.

Skin and soft tissues anesthetized with 3 mL of 2% lidocaine.

Under fluoroscopic guidance, 22 gauge spinal needle was advanced
into RIGHT shoulder joint.

Confirmation of intra-articular position achieved with injection of
3 mL of Omnipaque 300.

Patient was then injected with requested 40 mg Depo-Medrol and 3 mL
of 1% lidocaine.

Procedure tolerated well by patient without immediate complication.
IMPRESSION: Technically successful therapeutic RIGHT shoulder injection under
fluoroscopy.

## 2021-05-10 DIAGNOSIS — M170 Bilateral primary osteoarthritis of knee: Secondary | ICD-10-CM | POA: Diagnosis not present

## 2021-05-10 DIAGNOSIS — E7849 Other hyperlipidemia: Secondary | ICD-10-CM | POA: Diagnosis not present

## 2021-05-10 DIAGNOSIS — M25512 Pain in left shoulder: Secondary | ICD-10-CM | POA: Diagnosis not present

## 2021-05-10 DIAGNOSIS — Z6841 Body Mass Index (BMI) 40.0 and over, adult: Secondary | ICD-10-CM | POA: Diagnosis not present

## 2021-05-10 DIAGNOSIS — F4541 Pain disorder exclusively related to psychological factors: Secondary | ICD-10-CM | POA: Diagnosis not present

## 2021-05-10 DIAGNOSIS — I10 Essential (primary) hypertension: Secondary | ICD-10-CM | POA: Diagnosis not present

## 2021-05-10 DIAGNOSIS — F410 Panic disorder [episodic paroxysmal anxiety] without agoraphobia: Secondary | ICD-10-CM | POA: Diagnosis not present

## 2021-07-18 ENCOUNTER — Encounter (INDEPENDENT_AMBULATORY_CARE_PROVIDER_SITE_OTHER): Payer: Self-pay | Admitting: *Deleted

## 2021-08-09 DIAGNOSIS — F410 Panic disorder [episodic paroxysmal anxiety] without agoraphobia: Secondary | ICD-10-CM | POA: Diagnosis not present

## 2021-08-09 DIAGNOSIS — I10 Essential (primary) hypertension: Secondary | ICD-10-CM | POA: Diagnosis not present

## 2021-08-09 DIAGNOSIS — M170 Bilateral primary osteoarthritis of knee: Secondary | ICD-10-CM | POA: Diagnosis not present

## 2021-08-09 DIAGNOSIS — E7849 Other hyperlipidemia: Secondary | ICD-10-CM | POA: Diagnosis not present

## 2021-08-09 DIAGNOSIS — Z6841 Body Mass Index (BMI) 40.0 and over, adult: Secondary | ICD-10-CM | POA: Diagnosis not present

## 2021-08-09 DIAGNOSIS — F4541 Pain disorder exclusively related to psychological factors: Secondary | ICD-10-CM | POA: Diagnosis not present

## 2021-08-09 DIAGNOSIS — Z0000 Encounter for general adult medical examination without abnormal findings: Secondary | ICD-10-CM | POA: Diagnosis not present

## 2021-08-09 DIAGNOSIS — M25512 Pain in left shoulder: Secondary | ICD-10-CM | POA: Diagnosis not present

## 2021-11-09 DIAGNOSIS — Z0000 Encounter for general adult medical examination without abnormal findings: Secondary | ICD-10-CM | POA: Diagnosis not present

## 2021-11-09 DIAGNOSIS — M170 Bilateral primary osteoarthritis of knee: Secondary | ICD-10-CM | POA: Diagnosis not present

## 2021-11-09 DIAGNOSIS — F4541 Pain disorder exclusively related to psychological factors: Secondary | ICD-10-CM | POA: Diagnosis not present

## 2021-11-09 DIAGNOSIS — M25512 Pain in left shoulder: Secondary | ICD-10-CM | POA: Diagnosis not present

## 2021-11-09 DIAGNOSIS — F410 Panic disorder [episodic paroxysmal anxiety] without agoraphobia: Secondary | ICD-10-CM | POA: Diagnosis not present

## 2021-11-09 DIAGNOSIS — R7303 Prediabetes: Secondary | ICD-10-CM | POA: Diagnosis not present

## 2021-11-09 DIAGNOSIS — E7849 Other hyperlipidemia: Secondary | ICD-10-CM | POA: Diagnosis not present

## 2021-11-09 DIAGNOSIS — I10 Essential (primary) hypertension: Secondary | ICD-10-CM | POA: Diagnosis not present

## 2021-11-09 DIAGNOSIS — Z6841 Body Mass Index (BMI) 40.0 and over, adult: Secondary | ICD-10-CM | POA: Diagnosis not present

## 2021-12-15 DIAGNOSIS — Z1231 Encounter for screening mammogram for malignant neoplasm of breast: Secondary | ICD-10-CM | POA: Diagnosis not present

## 2022-01-30 DIAGNOSIS — E7849 Other hyperlipidemia: Secondary | ICD-10-CM | POA: Diagnosis not present

## 2022-01-30 DIAGNOSIS — I10 Essential (primary) hypertension: Secondary | ICD-10-CM | POA: Diagnosis not present

## 2022-01-30 DIAGNOSIS — M170 Bilateral primary osteoarthritis of knee: Secondary | ICD-10-CM | POA: Diagnosis not present

## 2022-01-30 DIAGNOSIS — F410 Panic disorder [episodic paroxysmal anxiety] without agoraphobia: Secondary | ICD-10-CM | POA: Diagnosis not present

## 2022-01-30 DIAGNOSIS — M25512 Pain in left shoulder: Secondary | ICD-10-CM | POA: Diagnosis not present

## 2022-01-30 DIAGNOSIS — Z6841 Body Mass Index (BMI) 40.0 and over, adult: Secondary | ICD-10-CM | POA: Diagnosis not present

## 2022-03-24 DIAGNOSIS — R064 Hyperventilation: Secondary | ICD-10-CM | POA: Diagnosis not present

## 2022-03-24 DIAGNOSIS — R079 Chest pain, unspecified: Secondary | ICD-10-CM | POA: Diagnosis not present

## 2022-03-24 DIAGNOSIS — R42 Dizziness and giddiness: Secondary | ICD-10-CM | POA: Diagnosis not present

## 2022-03-24 DIAGNOSIS — I10 Essential (primary) hypertension: Secondary | ICD-10-CM | POA: Diagnosis not present

## 2022-03-24 DIAGNOSIS — R0689 Other abnormalities of breathing: Secondary | ICD-10-CM | POA: Diagnosis not present

## 2022-03-24 DIAGNOSIS — R519 Headache, unspecified: Secondary | ICD-10-CM | POA: Diagnosis not present

## 2022-03-29 DIAGNOSIS — H81399 Other peripheral vertigo, unspecified ear: Secondary | ICD-10-CM | POA: Diagnosis not present

## 2022-03-29 DIAGNOSIS — Z6841 Body Mass Index (BMI) 40.0 and over, adult: Secondary | ICD-10-CM | POA: Diagnosis not present

## 2022-05-08 DIAGNOSIS — H81399 Other peripheral vertigo, unspecified ear: Secondary | ICD-10-CM | POA: Diagnosis not present

## 2022-05-08 DIAGNOSIS — Z6841 Body Mass Index (BMI) 40.0 and over, adult: Secondary | ICD-10-CM | POA: Diagnosis not present

## 2022-05-08 DIAGNOSIS — F410 Panic disorder [episodic paroxysmal anxiety] without agoraphobia: Secondary | ICD-10-CM | POA: Diagnosis not present

## 2022-05-08 DIAGNOSIS — Z0000 Encounter for general adult medical examination without abnormal findings: Secondary | ICD-10-CM | POA: Diagnosis not present

## 2022-05-08 DIAGNOSIS — I10 Essential (primary) hypertension: Secondary | ICD-10-CM | POA: Diagnosis not present

## 2022-06-05 DIAGNOSIS — Z780 Asymptomatic menopausal state: Secondary | ICD-10-CM | POA: Diagnosis not present

## 2022-06-05 DIAGNOSIS — M810 Age-related osteoporosis without current pathological fracture: Secondary | ICD-10-CM | POA: Diagnosis not present

## 2022-06-15 ENCOUNTER — Encounter: Payer: Self-pay | Admitting: Radiology

## 2022-08-14 DIAGNOSIS — Z0000 Encounter for general adult medical examination without abnormal findings: Secondary | ICD-10-CM | POA: Diagnosis not present

## 2022-08-14 DIAGNOSIS — H81399 Other peripheral vertigo, unspecified ear: Secondary | ICD-10-CM | POA: Diagnosis not present

## 2022-08-14 DIAGNOSIS — F410 Panic disorder [episodic paroxysmal anxiety] without agoraphobia: Secondary | ICD-10-CM | POA: Diagnosis not present

## 2022-08-14 DIAGNOSIS — I10 Essential (primary) hypertension: Secondary | ICD-10-CM | POA: Diagnosis not present

## 2022-08-14 DIAGNOSIS — Z6841 Body Mass Index (BMI) 40.0 and over, adult: Secondary | ICD-10-CM | POA: Diagnosis not present

## 2022-08-14 DIAGNOSIS — E559 Vitamin D deficiency, unspecified: Secondary | ICD-10-CM | POA: Diagnosis not present

## 2023-02-20 DIAGNOSIS — F419 Anxiety disorder, unspecified: Secondary | ICD-10-CM | POA: Diagnosis not present

## 2023-02-20 DIAGNOSIS — M25552 Pain in left hip: Secondary | ICD-10-CM | POA: Diagnosis not present

## 2023-02-20 DIAGNOSIS — G4700 Insomnia, unspecified: Secondary | ICD-10-CM | POA: Diagnosis not present

## 2023-02-20 DIAGNOSIS — Z79899 Other long term (current) drug therapy: Secondary | ICD-10-CM | POA: Diagnosis not present

## 2023-02-20 DIAGNOSIS — I10 Essential (primary) hypertension: Secondary | ICD-10-CM | POA: Diagnosis not present

## 2023-02-20 DIAGNOSIS — M25512 Pain in left shoulder: Secondary | ICD-10-CM | POA: Diagnosis not present

## 2023-02-20 DIAGNOSIS — M722 Plantar fascial fibromatosis: Secondary | ICD-10-CM | POA: Diagnosis not present

## 2023-02-20 DIAGNOSIS — F410 Panic disorder [episodic paroxysmal anxiety] without agoraphobia: Secondary | ICD-10-CM | POA: Diagnosis not present

## 2023-03-02 DIAGNOSIS — M1711 Unilateral primary osteoarthritis, right knee: Secondary | ICD-10-CM | POA: Diagnosis not present

## 2023-03-02 DIAGNOSIS — M25561 Pain in right knee: Secondary | ICD-10-CM | POA: Diagnosis not present

## 2023-03-09 DIAGNOSIS — M1711 Unilateral primary osteoarthritis, right knee: Secondary | ICD-10-CM | POA: Diagnosis not present

## 2023-03-22 DIAGNOSIS — Z1231 Encounter for screening mammogram for malignant neoplasm of breast: Secondary | ICD-10-CM | POA: Diagnosis not present

## 2023-05-24 DIAGNOSIS — M7501 Adhesive capsulitis of right shoulder: Secondary | ICD-10-CM | POA: Diagnosis not present

## 2023-05-24 DIAGNOSIS — E559 Vitamin D deficiency, unspecified: Secondary | ICD-10-CM | POA: Diagnosis not present

## 2023-05-24 DIAGNOSIS — F410 Panic disorder [episodic paroxysmal anxiety] without agoraphobia: Secondary | ICD-10-CM | POA: Diagnosis not present

## 2023-05-24 DIAGNOSIS — M25552 Pain in left hip: Secondary | ICD-10-CM | POA: Diagnosis not present

## 2023-05-24 DIAGNOSIS — M25561 Pain in right knee: Secondary | ICD-10-CM | POA: Diagnosis not present

## 2023-05-24 DIAGNOSIS — M19011 Primary osteoarthritis, right shoulder: Secondary | ICD-10-CM | POA: Diagnosis not present

## 2023-05-24 DIAGNOSIS — G4700 Insomnia, unspecified: Secondary | ICD-10-CM | POA: Diagnosis not present

## 2023-05-24 DIAGNOSIS — I10 Essential (primary) hypertension: Secondary | ICD-10-CM | POA: Diagnosis not present

## 2023-05-24 DIAGNOSIS — M722 Plantar fascial fibromatosis: Secondary | ICD-10-CM | POA: Diagnosis not present

## 2023-05-24 DIAGNOSIS — F419 Anxiety disorder, unspecified: Secondary | ICD-10-CM | POA: Diagnosis not present

## 2023-06-01 DIAGNOSIS — R739 Hyperglycemia, unspecified: Secondary | ICD-10-CM | POA: Diagnosis not present

## 2023-06-01 DIAGNOSIS — Z96652 Presence of left artificial knee joint: Secondary | ICD-10-CM | POA: Diagnosis not present

## 2023-06-01 DIAGNOSIS — M25552 Pain in left hip: Secondary | ICD-10-CM | POA: Diagnosis not present

## 2023-06-01 DIAGNOSIS — M1612 Unilateral primary osteoarthritis, left hip: Secondary | ICD-10-CM | POA: Diagnosis not present

## 2023-06-01 DIAGNOSIS — M25562 Pain in left knee: Secondary | ICD-10-CM | POA: Diagnosis not present

## 2023-06-08 DIAGNOSIS — Z6841 Body Mass Index (BMI) 40.0 and over, adult: Secondary | ICD-10-CM | POA: Diagnosis not present

## 2023-06-08 DIAGNOSIS — E669 Obesity, unspecified: Secondary | ICD-10-CM | POA: Diagnosis not present

## 2023-06-27 DIAGNOSIS — M7989 Other specified soft tissue disorders: Secondary | ICD-10-CM | POA: Diagnosis not present

## 2023-06-27 DIAGNOSIS — M79604 Pain in right leg: Secondary | ICD-10-CM | POA: Diagnosis not present

## 2023-06-27 DIAGNOSIS — M79661 Pain in right lower leg: Secondary | ICD-10-CM | POA: Diagnosis not present

## 2023-07-11 DIAGNOSIS — F439 Reaction to severe stress, unspecified: Secondary | ICD-10-CM | POA: Diagnosis not present

## 2023-07-11 DIAGNOSIS — F411 Generalized anxiety disorder: Secondary | ICD-10-CM | POA: Diagnosis not present

## 2023-07-11 DIAGNOSIS — F09 Unspecified mental disorder due to known physiological condition: Secondary | ICD-10-CM | POA: Diagnosis not present
